# Patient Record
Sex: Male | Born: 1990 | Race: Black or African American | Hispanic: No | Marital: Single | Smoking: Former smoker
Health system: Southern US, Community
[De-identification: ages and names within clinical notes are randomized; demographics above are authoritative.]

## PROBLEM LIST (undated history)

## (undated) DIAGNOSIS — I480 Paroxysmal atrial fibrillation: Secondary | ICD-10-CM

## (undated) DIAGNOSIS — A749 Chlamydial infection, unspecified: Secondary | ICD-10-CM

## (undated) DIAGNOSIS — A64 Unspecified sexually transmitted disease: Secondary | ICD-10-CM

## (undated) HISTORY — DX: Unspecified sexually transmitted disease: A64

## (undated) HISTORY — DX: Paroxysmal atrial fibrillation: I48.0

---
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
## 2012-10-12 ENCOUNTER — Encounter (HOSPITAL_COMMUNITY): Payer: Self-pay | Admitting: *Deleted

## 2012-10-12 ENCOUNTER — Emergency Department (HOSPITAL_COMMUNITY)
Admission: EM | Admit: 2012-10-12 | Discharge: 2012-10-12 | Disposition: A | Discharge status: Home / Self Care | Payer: 59 | Source: Home / Self Care | Attending: Family Medicine | Admitting: Family Medicine

## 2012-10-12 DIAGNOSIS — Z043 Encounter for examination and observation following other accident: Secondary | ICD-10-CM

## 2012-10-12 DIAGNOSIS — Z041 Encounter for examination and observation following transport accident: Secondary | ICD-10-CM
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
## 2013-07-12 ENCOUNTER — Ambulatory Visit: Payer: 59 | Admitting: Family Medicine

## 2013-07-12 VITALS — BP 100/72 | HR 60 | Temp 97.9°F | Resp 18 | Ht 65.0 in | Wt 136.0 lb

## 2013-07-12 NOTE — Progress Notes (Signed)
  Subjective:    Patient ID: Allen Wang, male    DOB: 12/13/1990, 22 y.o.   MRN: [id number]  HPI Allen Wang is a 22 y.o. male No known medical problems.  First visit to this office.   Has paperwork  to complete to donate plasma. Has donated in past - 2 years in a row - ended in April.  Had a reaction during blood return with saline in April of this year, has not donated since that time. Felt like there was air in tube, had pain in arm, no rash, no swelling, pain in R arm for about a week. No bruising. Not really sure what happened. Told not to donate again until has had form completed., but no form to be completed here.   Their facility does have a doctor that meets with them Mondays and Wednesdays.    Review of Systems     Objective:   Physical Exam        Assessment & Plan:  No specific form available here. I am not sure what specific information is needed as no form provided. Gave patient my card to have donation center call with specific info requested, or may be better to meet with doctor that attends center tomorrow, and then can return if needed for specific testing or clearance required once more detailed info needed.

## 2013-07-20 ENCOUNTER — Telehealth: Payer: Self-pay

## 2013-07-20 NOTE — Telephone Encounter (Signed)
Form can not be completed at this point. Patient will need to be seen for this, and we need more information from the plasma center concerning the problems he had related to his right arm pain.

## 2013-07-20 NOTE — Telephone Encounter (Signed)
Pt dropped off a form for Dr Neva Seat to complete concerning complications from plasma donation. I have put the form in Dr Paralee Cancel box.

## 2013-07-30 NOTE — Telephone Encounter (Signed)
Allen Wang, did you contact pt with information? Can we close encounter?

## 2013-08-01 NOTE — Telephone Encounter (Signed)
We can close, patient not returning my calls.

## 2013-08-02 ENCOUNTER — Ambulatory Visit (INDEPENDENT_AMBULATORY_CARE_PROVIDER_SITE_OTHER): Payer: 59 | Admitting: Family Medicine

## 2013-08-02 VITALS — BP 118/76 | HR 74 | Temp 97.8°F | Resp 16 | Ht 66.0 in | Wt 140.6 lb

## 2013-08-02 DIAGNOSIS — Z23 Encounter for immunization: Secondary | ICD-10-CM

## 2013-08-02 DIAGNOSIS — Z0000 Encounter for general adult medical examination without abnormal findings: Secondary | ICD-10-CM

## 2013-08-02 DIAGNOSIS — Z7251 High risk heterosexual behavior: Secondary | ICD-10-CM

## 2013-08-02 LAB — POCT CBC
Granulocyte percent: 61.3 %G (ref 37–80)
MCV: 92.5 fL (ref 80–97)
MID (cbc): 0.5 (ref 0–0.9)
POC LYMPH PERCENT: 32 %L (ref 10–50)
Platelet Count, POC: 245 10*3/uL (ref 142–424)
RDW, POC: 13.6 %

## 2013-08-02 LAB — POCT URINALYSIS DIPSTICK
Bilirubin, UA: NEGATIVE
Blood, UA: NEGATIVE
Ketones, UA: NEGATIVE
Leukocytes, UA: NEGATIVE
pH, UA: 7.5

## 2013-08-02 NOTE — Progress Notes (Signed)
[address]   [place], [place]  [address]   [phone]  Subjective:    Patient ID: Allen Wang, male    DOB: 02-16-1991, 22 y.o.   MRN: [id number]  HPI This 22 y.o. male presents for evaluation of CPE.  Last physical high school. Colonoscopy never. Tetanus vaccine several years ago.   Flu vaccines in a few years.  Agreeable. Gardisil series never.   Eye exam never; no glasses or contacts. Dental exam every six months.  Review of Systems  Constitutional: Negative.   HENT: Negative.   Eyes: Negative.   Respiratory: Negative.   Cardiovascular: Negative.   Gastrointestinal: Negative.   Endocrine: Negative.   Genitourinary: Negative.   Musculoskeletal: Negative.   Skin: Negative.   Allergic/Immunologic: Negative.   Neurological: Negative.   Hematological: Negative.   Psychiatric/Behavioral: Negative.    History reviewed. No pertinent past medical history. History reviewed. No pertinent past surgical history. No Known Allergies No current outpatient prescriptions on file prior to visit.   No current facility-administered medications on file prior to visit.   History   Social History  . Marital Status: Single    Spouse Name: N/A    Number of Children: N/A  . Years of Education: N/A   Occupational History  . Not on file.   Social History Main Topics  . Smoking status: Former Smoker    Types: Cigars  . Smokeless tobacco: Not on file  . Alcohol Use: Yes     Comment: occasional  . Drug Use: Yes    Special: Marijuana     Comment: none for 2 yrs.  . Sexual Activity: Yes   Other Topics Concern  . Not on file   Social History Narrative   Marital status: single; dates females only.      Children: none      Lives: with parents.      Employment: [employer]; Product/process development scientist; likes job; 40 hours per week.      Tobacco: none       Alcohol: weekends; no DWIs.       Drugs: none       Sexual activity:  Age 33; total partners = 5; no history of STDs; condoms  100%.      Seatbelts:  100%      Guns: none         Family History  Problem Relation Age of Onset  . Diabetes Mother   . Hypertension Mother   . Diabetes Maternal Grandmother   . Depression Maternal Grandfather        Objective:   Physical Exam  Nursing note and vitals reviewed. Constitutional: He is oriented to person, place, and time. He appears well-developed and well-nourished. No distress.  HENT:  Head: Normocephalic and atraumatic.  Right Ear: External ear normal.  Left Ear: External ear normal.  Nose: Nose normal.  Mouth/Throat: Oropharynx is clear and moist.  Eyes: Conjunctivae and EOM are normal. Pupils are equal, round, and reactive to light.  Neck: Normal range of motion. Neck supple. No thyromegaly present.  Cardiovascular: Normal rate, regular rhythm, normal heart sounds and intact distal pulses.   No murmur heard. Pulmonary/Chest: Effort normal and breath sounds normal. No respiratory distress. He has no wheezes. He has no rales.  Abdominal: Soft. Bowel sounds are normal. He exhibits no distension and no mass. There is no tenderness. There is no rebound and no guarding. Hernia confirmed negative in the right inguinal area and confirmed negative in the  left inguinal area.  Genitourinary: Testes normal and penis normal.  Musculoskeletal: Normal range of motion.  Lymphadenopathy:    He has no cervical adenopathy.       Right: No inguinal adenopathy present.       Left: No inguinal adenopathy present.  Neurological: He is alert and oriented to person, place, and time. He has normal reflexes. No cranial nerve deficit. He exhibits normal muscle tone. Coordination normal.  Skin: Skin is warm and dry. No rash noted. He is not diaphoretic. No erythema. No pallor.  Psychiatric: He has a normal mood and affect. His behavior is normal. Judgment and thought content normal.   INFLUENZA VACCINE ADMINISTERED.    Assessment & Plan:  Routine general medical examination at a  health care facility - Plan: POCT urinalysis dipstick, POCT CBC, Comprehensive metabolic panel, RPR, HIV antibody, Lipid panel, GC/Chlamydia Probe Amp  Problems related to high-risk sexual behavior - Plan: POCT urinalysis dipstick, RPR, HIV antibody, GC/Chlamydia Probe Amp  Need for prophylactic vaccination and inoculation against influenza - Plan: Flu Vaccine QUAD 36+ mos IM  1.  CPE: anticipatory guidance provided.  S/p flu vaccine in office. Obtain labs. Per current guidelines, obtain GC/Chlam. 2.  STD screening/problems related to high risk sexual behavior:  New. Obtain Gc/Chlam, RPR, HIV.  Counseling provided on HIV screening.  Safe sex practices reviewed. 3.  S/p flu vaccine.

## 2013-08-02 NOTE — Patient Instructions (Addendum)
1. Call insurance regarding coverage of Gardisil series.  Human Papillomavirus (HPV) Gardasil Vaccine What You Need to Know WHAT IS HPV?  Genital human papillomavirus (HPV) is the most common sexually transmitted virus in the [place]. More than half of sexually active men and women are infected with HPV at some time in their lives.  About 20 million Americans are currently infected, and about 6 million more get infected each year. HPV is usually spread through sexual contact.  Most HPV infections do not cause any symptoms and go away on their own. But HPV can cause cervical cancer in women. Cervical cancer is the 2nd leading cause of cancer deaths among women around the world. In the [place], about 12,000 women get cervical cancer every year and about 4,000 are expected to die from it.  HPV is also associated with several less common cancers, such as vaginal and vulvar cancers in women, and anal and oropharyngeal (back of the throat, including base of tongue and tonsils) cancers in both men and women. HPV can also cause genital warts and warts in the throat.  There is no cure for HPV infection, but some of the problems it causes can be treated. HPV VACCINE: WHY GET VACCINATED?  The HPV vaccine you are getting is 1 of 2 vaccines that can be given to prevent HPV. It may be given to both males and females.  This vaccine can prevent most cases of cervical cancer in females, if it is given before exposure to the virus. In addition, it can prevent vaginal and vulvar cancer in females, and genital warts and anal cancer in both males and females.  Protection from HPV vaccine is expected to be long-lasting. But vaccination is not a substitute for cervical cancer screening. Women should still get regular Pap tests. WHO SHOULD GET THIS HPV VACCINE AND WHEN? HPV vaccine is given as a 3-dose series.  1st Dose: Now.  2nd Dose: 1 to 2 months after Dose 1.  3rd Dose: 6 months after Dose  1. Additional (booster) doses are not recommended. Routine Vaccination This HPV vaccine is recommended for girls and boys 5 or 20 years of age. It may be given starting at age 35. Why is HPV vaccine recommended at 66 or 20 years of age?  HPV infection is easily acquired, even with only one sex partner. That is why it is important to get HPV vaccine before any sexual conact takes place. Also, response to the vaccine is better at this age than at older ages. Catch-Up Vaccination This vaccine is recommended for the following people who have not completed the 3-dose series:   Females 13 through 20 years of age.  Males 13 through 20 years of age. This vaccine may be given to men 22 through 20 years of age who have not completed the 3-dose series. It is recommended for men through age 17 who have sex with men or whose immune system is weakened because of HIV infection, other illness, or medications.  HPV vaccine may be given at the same time as other vaccines. SOME PEOPLE SHOULD NOT GET HPV VACCINE OR SHOULD WAIT  Anyone who has ever had a life-threatening allergic reaction to any other component of HPV vaccine, or to a previous dose of HPV vaccine, should not get the vaccine. Tell your doctor if the person getting vaccinated has any severe allergies, including an allergy to yeast.  HPV vaccine is not recommended for pregnant women. However, receiving HPV vaccine when pregnant  is not a reason to consider terminating the pregnancy. Women who are breastfeeding may get the vaccine.  People who are mildly ill when a dose of HPV is planned can still be vaccinated. People with a moderate or severe illness should wait until they are better. WHAT ARE THE RISKS FROM THIS VACCINE?  This HPV vaccine has been used in the [place] and around the world for about 6 years and has been very safe.  However, any medicine could possibly cause a serious problem, such as a severe allergic reaction. The risk of any vaccine  causing a serious injury, or death, is extremely small.  Life-threatening allergic reactions from vaccines are very rare. If they do occur, it would be within a few minutes to a few hours after the vaccination. Several mild to moderate problems are known to occur with HPV vaccine. These do not last long and go away on their own.  Reactions in the arm where the shot was given:  Pain (about 8 people in 10).  Redness or swelling (about 1 person in 4).  Fever:  Mild (100 F or 37.8 C) (about 1 person in 10).  Moderate (102 F or 38.9 C) (about 1 person in 67).  Other problems:  Headache (about 1 person in 3).  Fainting: Brief fainting spells and related symptoms (such as jerking movements) can happen after any medical procedure, including vaccination. Sitting or lying down for about 15 minutes after a vaccination can help prevent fainting and injuries caused by falls. Tell your doctor if the patient feels dizzy or lightheaded, or has vision changes or ringing in the ears.  Like all vaccines, HPV vaccines will continue to be monitored for unusual or severe problems. WHAT IF THERE IS A SERIOUS REACTION? What should I look for?  Any unusual condition, such as a high fever or unusual behavior. Signs of a serious allergic reaction can include difficulty breathing, hoarseness or wheezing, hives, paleness, weakness, a fast heartbeat, or dizziness. What should I do?  Call a doctor, or get the person to a doctor right away.  Tell your doctor what happened, the date and time it happened, and when the vaccination was given.  Ask your doctor, nurse, or health department to report the reaction by filing a Vaccine Adverse Event Reporting System (VAERS) form. Or, you can file this report through the VAERS website at www.vaers.LAgents.no or by calling 1-(339)252-6647. VAERS does not provide medical advice. THE NATIONAL VACCINE INJURY COMPENSATION PROGRAM  The National Vaccine Injury Compensation  Program (VICP) is a federal program that was created to compensate people who may have been injured by certain vaccines.  Persons who believe they may have been injured by a vaccine can learn about the program and about filing a claim by calling 1-(609)862-1721 or visiting the VICP website at SpiritualWord.at HOW CAN I LEARN MORE?  Ask your doctor.  Call your local or state health department.  Contact the Centers for Disease Control and Prevention (CDC):  Call (949)886-5695 (1-800-CDC-INFO)  or  Visit CDC's website at PicCapture.uy CDC Human Papillomavirus (HPV) Gardasil (Interim) 04/09/12 Document Released: 09/07/2006 Document Revised: 08/04/2012 Document Reviewed: 01/15/2011 [facility] Patient Information 2014 [place], [place].

## 2013-08-03 LAB — COMPREHENSIVE METABOLIC PANEL
AST: 34 U/L (ref 0–37)
Albumin: 4.9 g/dL (ref 3.5–5.2)
Alkaline Phosphatase: 68 U/L (ref 39–117)
BUN: 12 mg/dL (ref 6–23)
Creat: 1.02 mg/dL (ref 0.50–1.35)
Potassium: 4.1 mEq/L (ref 3.5–5.3)

## 2013-08-03 LAB — LIPID PANEL
Cholesterol: 185 mg/dL (ref 0–200)
HDL: 70 mg/dL (ref >39)
Total CHOL/HDL Ratio: 2.6 Ratio
Triglycerides: 131 mg/dL (ref <150)
VLDL: 26 mg/dL (ref 0–40)

## 2013-08-23 ENCOUNTER — Ambulatory Visit (INDEPENDENT_AMBULATORY_CARE_PROVIDER_SITE_OTHER): Payer: 59 | Admitting: Family Medicine

## 2013-08-23 VITALS — BP 116/68 | HR 82 | Temp 97.9°F | Resp 18 | Ht 65.0 in | Wt 136.0 lb

## 2013-08-23 DIAGNOSIS — Z0000 Encounter for general adult medical examination without abnormal findings: Secondary | ICD-10-CM
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
## 2014-03-11 ENCOUNTER — Ambulatory Visit (INDEPENDENT_AMBULATORY_CARE_PROVIDER_SITE_OTHER): Payer: 59 | Admitting: Physician Assistant

## 2014-03-11 VITALS — BP 110/70 | HR 73 | Temp 98.6°F | Resp 18 | Ht 65.0 in | Wt 133.0 lb

## 2014-03-11 DIAGNOSIS — Z202 Contact with and (suspected) exposure to infections with a predominantly sexual mode of transmission: Secondary | ICD-10-CM

## 2014-03-11 DIAGNOSIS — Z113 Encounter for screening for infections with a predominantly sexual mode of transmission: Secondary | ICD-10-CM

## 2014-03-11 LAB — RPR

## 2014-03-11 MED ORDER — AZITHROMYCIN 500 MG PO TABS: 1000.0000 mg | ORAL_TABLET | Freq: Once | ORAL | Status: DC

## 2014-03-11 NOTE — Progress Notes (Signed)
   Subjective:    Patient ID: Allen Wang, male    DOB: 11/02/1991, 23 y.o.   MRN: [id number]  HPI   Mr. Allen Wang is a pleasant 23 yr old male who reports that a former girlfriend told him that she tested positive for chlamydia.  He was last with this partner about 1 wk ago - no other partners since that time.  He did not use a condom with that encounter.  He currently is asymptomatic.  Denies dysuria, pain, penile discharge.  He was lasted tested for STIs in Sept 2014 - all neg.  He has never tested positive.     Review of Systems  Constitutional: Negative for fever and chills.  Respiratory: Negative.   Cardiovascular: Negative.   Gastrointestinal: Negative for nausea, vomiting and abdominal pain.  Genitourinary: Negative for dysuria, discharge, genital sores and testicular pain.  Musculoskeletal: Negative.   Skin: Negative.        Objective:   Physical Exam  Vitals reviewed. Constitutional: He is oriented to person, place, and time. He appears well-developed and well-nourished. No distress.  HENT:  Head: Normocephalic and atraumatic.  Eyes: Conjunctivae are normal. No scleral icterus.  Cardiovascular: Normal rate, regular rhythm and normal heart sounds.   Pulmonary/Chest: Effort normal and breath sounds normal. He has no wheezes. He has no rales.  Abdominal: Soft. There is no tenderness.  Neurological: He is alert and oriented to person, place, and time.  Skin: Skin is warm and dry.  Psychiatric: He has a normal mood and affect. His behavior is normal.       Assessment & Plan:  Exposure to chlamydia - Plan: GC/Chlamydia Probe Amp, azithromycin (ZITHROMAX) 500 MG tablet  Screen for STD (sexually transmitted disease) - Plan: HIV antibody, RPR   Mr. Allen Wang is a very pleasant 23 yr old male here for STD screening post chlamydia exposure.  Will treat empirically with 1g azithro x1.  Uriprobe, HIV, RPR sent.  Instructed pt to abstain from sexual activity for the next 7 days to  ensure that any infection completely clears.  Encouraged him to use condoms with every sexual encounter going forward.  Pt to call or RTC if worsening or not improving  E. Frances FurbishElizabeth Finnick Orosz MHS, PA-C [facility] 4/18/20153:38 PM

## 2014-03-11 NOTE — Patient Instructions (Signed)
We are treating you for chlamydia today as a precaution.  Take the two azithromycin pills today at one time.  Take with some food so it doesn't upset your stomach  Abstain from all sexual activity for the next 7 days to make sure any infection completely clears  Going forward, use condoms with every sexual encounter - this is the best way to protect yourself from infection  I will let you know when your labs are back and if we need to do anything else based on those    Safe Sex Safe sex is about reducing the risk of giving or getting a sexually transmitted disease (STD). STDs are spread through sexual contact involving the genitals, mouth, or rectum. Some STDS can be cured and others cannot. Safe sex can also prevent unintended pregnancies.  SAFE SEX PRACTICES  Limit your sexual activity to only one partner who is only having sex with you.  Talk to your partner about their past partners, past STDs, and drug use.  Use a condom every time you have sexual intercourse. This includes vaginal, oral, and anal sexual activity. Both females and males should wear condoms during oral sex. Only use latex or polyurethane condoms and water-based lubricants. Petroleum-based lubricants or oils used to lubricate a condom will weaken the condom and increase the chance that it will break. The condom should be in place from the beginning to the end of sexual activity. Wearing a condom reduces, but does not completely eliminate, your risk of getting or giving a STD. STDs can be spread by contact with skin of surrounding areas.  Get vaccinated for hepatitis B and HPV.  Avoid alcohol and recreational drugs which can affect your judgement. You may forget to use a condom or participate in high-risk sex.  For females, avoid douching after sexual intercourse. Douching can spread an infection farther into the reproductive tract.  Check your body for signs of sores, blisters, rashes, or unusual discharge. See your  caregiver if you notice any of these signs.  Avoid sexual contact if you have symptoms of an infection or are being treated for an STD. If you or your partner has herpes, avoid sexual contact when blisters are present. Use condoms at all other times.  See your caregiver for regular screenings, examinations, and tests for STDs. Before having sex with a new partner, each of you should be screened for STDs and talk about the results with your partner. BENEFITS OF SAFE SEX   There is less of a chance of getting or giving an STD.  You can prevent unwanted or unintended pregnancies.  By discussing safer sex concerns with your partner, you may increase feelings of intimacy, comfort, trust, and honesty between the both of you. Document Released: 12/18/2004 Document Revised: 08/04/2012 Document Reviewed: 05/03/2012 [facility] Patient Information 2014 [employer], [place].

## 2014-03-12 LAB — HIV ANTIBODY (ROUTINE TESTING W REFLEX): HIV 1&2 Ab, 4th Generation: NONREACTIVE

## 2014-03-13 LAB — GC/CHLAMYDIA PROBE AMP
CT PROBE, AMP APTIMA: NEGATIVE
GC PROBE AMP APTIMA: NEGATIVE

## 2014-04-25 ENCOUNTER — Ambulatory Visit (INDEPENDENT_AMBULATORY_CARE_PROVIDER_SITE_OTHER): Payer: 59 | Admitting: Emergency Medicine

## 2014-04-25 VITALS — BP 112/72 | HR 76 | Temp 97.6°F | Resp 18 | Ht 65.3 in | Wt 140.6 lb

## 2014-04-25 DIAGNOSIS — I808 Phlebitis and thrombophlebitis of other sites: Secondary | ICD-10-CM

## 2014-04-25 NOTE — Progress Notes (Signed)
   Subjective:    Patient ID: Allen Wang, male    DOB: 1991/10/21, 21 y.o.   MRN: [id number]  HPI 23 yo male with right arm pain after donating plasma 4 days ago.  He felt pain in antecubital fossa and had pain into bicep region.  No fever or chills.  No erythema.  States there was line that came up into bicep that ran about 2 inches long.  Now gone.  PPMH:  Noncontributory  SH:  Former smoker, occasional alcohol.   Review of Systems As per HPI, otherwise negative.    Objective:   Physical Exam Blood pressure 112/72, pulse 76, temperature 97.6 F (36.4 C), temperature source Oral, resp. rate 18, height 5' 5.3" (1.659 m), weight 140 lb 9.6 oz (63.776 kg), SpO2 100.00%. Body mass index is 23.17 kg/(m^2). Well-developed, well nourished male who is awake, alert and oriented, in NAD. HEENT: [place]/AT, PERRL, EOMI.  Sclera and conjunctiva are clear.  Lungs: normal effort Extremities: mild ttp to antecubital fossa.  No erythema.  No warmth. Skin: warm and dry without rash. Psychologic: good mood and appropriate affect, normal speech and behavior.      Assessment & Plan:  Superficial thrombophlebitis - recommended that he use warm compresses as needed for 4-5 days.  No need for antibiotics.

## 2016-01-03 ENCOUNTER — Encounter (HOSPITAL_COMMUNITY): Payer: Self-pay | Admitting: *Deleted

## 2016-01-03 ENCOUNTER — Emergency Department (HOSPITAL_COMMUNITY)
Admission: EM | Admit: 2016-01-03 | Discharge: 2016-01-04 | Disposition: A | Discharge status: Home / Self Care | Payer: 59 | Attending: Emergency Medicine | Admitting: Emergency Medicine

## 2016-01-03 ENCOUNTER — Emergency Department (HOSPITAL_COMMUNITY): Payer: 59

## 2016-01-03 DIAGNOSIS — I4891 Unspecified atrial fibrillation: Secondary | ICD-10-CM | POA: Diagnosis not present

## 2016-01-03 DIAGNOSIS — R002 Palpitations: Secondary | ICD-10-CM | POA: Diagnosis present

## 2016-01-03 DIAGNOSIS — Z87891 Personal history of nicotine dependence: Secondary | ICD-10-CM | POA: Insufficient documentation

## 2016-01-03 LAB — BASIC METABOLIC PANEL
Anion gap: 9 (ref 5–15)
BUN: 16 mg/dL (ref 6–20)
CALCIUM: 9.5 mg/dL (ref 8.9–10.3)
CHLORIDE: 104 mmol/L (ref 101–111)
CO2: 27 mmol/L (ref 22–32)
CREATININE: 0.79 mg/dL (ref 0.61–1.24)
GFR calc non Af Amer: >60 mL/min (ref >60)
Glucose, Bld: 90 mg/dL (ref 65–99)
Potassium: 3.7 mmol/L (ref 3.5–5.1)
SODIUM: 140 mmol/L (ref 135–145)

## 2016-01-03 LAB — CBC
HCT: 44.3 % (ref 39.0–52.0)
Hemoglobin: 15.2 g/dL (ref 13.0–17.0)
MCH: 29.7 pg (ref 26.0–34.0)
MCHC: 34.3 g/dL (ref 30.0–36.0)
MCV: 86.5 fL (ref 78.0–100.0)
PLATELETS: 252 10*3/uL (ref 150–400)
RBC: 5.12 MIL/uL (ref 4.22–5.81)
RDW: 12.9 % (ref 11.5–15.5)
WBC: 7.4 10*3/uL (ref 4.0–10.5)

## 2016-01-03 LAB — PROTIME-INR
INR: 1.06 (ref 0.00–1.49)
PROTHROMBIN TIME: 13.6 s (ref 11.6–15.2)

## 2016-01-03 LAB — RAPID URINE DRUG SCREEN, HOSP PERFORMED
AMPHETAMINES: NOT DETECTED
BARBITURATES: NOT DETECTED
BENZODIAZEPINES: NOT DETECTED
COCAINE: NOT DETECTED
OPIATES: NOT DETECTED
TETRAHYDROCANNABINOL: NOT DETECTED

## 2016-01-03 LAB — APTT: aPTT: 30 seconds (ref 24–37)

## 2016-01-03 LAB — I-STAT TROPONIN, ED: TROPONIN I, POC: 0 ng/mL (ref 0.00–0.08)

## 2016-01-03 LAB — MAGNESIUM: Magnesium: 2 mg/dL (ref 1.7–2.4)

## 2016-01-03 MED ORDER — DILTIAZEM HCL 100 MG IV SOLR
5.0000 mg/h | INTRAVENOUS | Status: DC
  Administered 2016-01-03: 5 mg/h via INTRAVENOUS
  Filled 2016-01-03: qty 100

## 2016-01-03 MED ORDER — SODIUM CHLORIDE 0.9 % IV BOLUS (SEPSIS)
1000.0000 mL | Freq: Once | INTRAVENOUS | Status: AC
  Administered 2016-01-03: 1000 mL via INTRAVENOUS

## 2016-01-03 MED ORDER — SODIUM CHLORIDE 0.9 % IV SOLN
INTRAVENOUS | Status: DC
  Administered 2016-01-04: 01:00:00 via INTRAVENOUS

## 2016-01-03 MED ORDER — DILTIAZEM LOAD VIA INFUSION
20.0000 mg | Freq: Once | INTRAVENOUS | Status: AC
  Administered 2016-01-03: 20 mg via INTRAVENOUS
  Filled 2016-01-03: qty 20

## 2016-01-03 NOTE — ED Notes (Signed)
Pt. On cardiac monitor. 

## 2016-01-03 NOTE — ED Provider Notes (Signed)
CSN: [id number]     Arrival date & time 01/03/16  2111 History   First MD Initiated Contact with Patient 01/03/16 2143     Chief Complaint  Patient presents with  . Palpitations   Patient is a 25 y.o. male presenting with palpitations.  Palpitations Palpitations quality:  Fast (fast) Onset quality:  Sudden Duration:  3 hours Timing:  Constant Chronicity:  New Context: not appetite suppressants, not caffeine, not exercise, not hyperventilation, not illicit drugs and not stimulant use   Relieved by:  Nothing Ineffective treatments:  None tried Pt noticed the symptoms suddenly while watching TV.  He felt his heart beating fast and irregularly.  No alcohol or drug use.  NO prior episodes like this.  History reviewed. No pertinent past medical history. History reviewed. No pertinent past surgical history. Family History  Problem Relation Age of Onset  . Diabetes Mother   . Hypertension Mother   . Diabetes Maternal Grandmother   . Depression Maternal Grandfather    Social History  Substance Use Topics  . Smoking status: Former Smoker    Types: Cigars  . Smokeless tobacco: None  . Alcohol Use: Yes     Comment: occasional    Review of Systems  Cardiovascular: Positive for palpitations.  All other systems reviewed and are negative.     Allergies  Review of patient's allergies indicates no known allergies.  Home Medications   Prior to Admission medications   Not on File   BP 113/81 mmHg  Pulse 115  Temp(Src) 97.8 F (36.6 C) (Oral)  Resp 19  SpO2 99% Physical Exam  Constitutional: He appears well-developed and well-nourished. No distress.  HENT:  Head: Normocephalic and atraumatic.  Right Ear: External ear normal.  Left Ear: External ear normal.  Eyes: Conjunctivae are normal. Right eye exhibits no discharge. Left eye exhibits no discharge. No scleral icterus.  Neck: Neck supple. No tracheal deviation present. No thyromegaly present.  Cardiovascular: Intact  distal pulses.  An irregularly irregular rhythm present. Tachycardia present.   Pulmonary/Chest: Effort normal and breath sounds normal. No stridor. No respiratory distress. He has no wheezes. He has no rales.  Abdominal: Soft. Bowel sounds are normal. He exhibits no distension. There is no tenderness. There is no rebound and no guarding.  Musculoskeletal: He exhibits no edema or tenderness.  Neurological: He is alert. He has normal strength. No cranial nerve deficit (no facial droop, extraocular movements intact, no slurred speech) or sensory deficit. He exhibits normal muscle tone. He displays no seizure activity. Coordination normal.  Skin: Skin is warm and dry. No rash noted.  Psychiatric: He has a normal mood and affect.  Nursing note and vitals reviewed.   ED Course  Procedures (including critical care time) Labs Review Labs Reviewed  BASIC METABOLIC PANEL  CBC  APTT  PROTIME-INR  MAGNESIUM  URINE RAPID DRUG SCREEN, HOSP PERFORMED  TSH  T4, FREE  I-STAT TROPOININ, ED  Rosezena Sensor, ED    Imaging Review Dg Chest 2 View  01/03/2016  CLINICAL DATA:  25 year old male with tachycardia and chest pain EXAM: CHEST  2 VIEW COMPARISON:  None. FINDINGS: The heart size and mediastinal contours are within normal limits. Both lungs are clear. The visualized skeletal structures are unremarkable. IMPRESSION: No active cardiopulmonary disease. Electronically Signed   By: Elgie Collard M.D.   On: 01/03/2016 21:57   I have personally reviewed and evaluated these images and lab results as part of my medical decision-making.   EKG Interpretation  Date/Time:  Thursday January 03 2016 21:38:56 EST Ventricular Rate:  136 PR Interval:    QRS Duration: 90 QT Interval:  311 QTC Calculation: 468 R Axis:   70 Text Interpretation:  Atrial fibrillation ST elevation, consider inferior  injury Baseline wander in lead(s) II aVF V5 No old tracing to compare  Confirmed by Dessirae Scarola  MD-J, Tyrell Brereton  ([id number]) on 01/03/2016 9:44:25 PM      MDM   Final diagnoses:  Atrial fibrillation with tachycardic ventricular rate (HCC)    Pt has new onset a fib.  Etiology unclear.  No thyromegally.  Denies stimulant use. Chadsvasc2 is 0.  Will give cardizem to bring his rate down.  Plan on reassessment and consult with cardiology.  Dr Nicanor Alcon will follow    Linwood Dibbles, MD 01/03/16 2352

## 2016-01-03 NOTE — ED Notes (Signed)
Pt states he was having a "fast heart rate and pressure in my chest" while watching tv today, starting around 7pm, comes and goes.

## 2016-01-04 LAB — T4, FREE: Free T4: 0.72 ng/dL (ref 0.61–1.12)

## 2016-01-04 LAB — TSH: TSH: 2.594 u[IU]/mL (ref 0.350–4.500)

## 2016-01-04 MED ORDER — ASPIRIN 81 MG PO CHEW: 81.0000 mg | CHEWABLE_TABLET | Freq: Every day | ORAL | Status: DC

## 2016-01-04 NOTE — Discharge Instructions (Signed)
Atrial Fibrillation °Atrial fibrillation is a type of heartbeat that is irregular or fast (rapid). If you have this condition, your heart keeps quivering in a weird (chaotic) way. This condition can make it so your heart cannot pump blood normally. Having this condition gives a person more risk for stroke, heart failure, and other heart problems. There are different types of atrial fibrillation. Talk with your doctor to learn about the type that you have. °HOME CARE °· Take over-the-counter and prescription medicines only as told by your doctor. °· If your doctor prescribed a blood-thinning medicine, take it exactly as told. Taking too much of it can cause bleeding. If you do not take enough of it, you will not have the protection that you need against stroke and other problems. °· Do not use any tobacco products. These include cigarettes, chewing tobacco, and e-cigarettes. If you need help quitting, ask your doctor. °· If you have apnea (obstructive sleep apnea), manage it as told by your doctor. °· Do not drink alcohol. °· Do not drink beverages that have caffeine. These include coffee, soda, and tea. °· Maintain a healthy weight. Do not use diet pills unless your doctor says they are safe for you. Diet pills may make heart problems worse. °· Follow diet instructions as told by your doctor. °· Exercise regularly as told by your doctor. °· Keep all follow-up visits as told by your doctor. This is important. °GET HELP IF: °· You notice a change in the speed, rhythm, or strength of your heartbeat. °· You are taking a blood-thinning medicine and you notice more bruising. °· You get tired more easily when you move or exercise. °GET HELP RIGHT AWAY IF: °· You have pain in your chest or your belly (abdomen). °· You have sweating or weakness. °· You feel sick to your stomach (nauseous). °· You notice blood in your throw up (vomit), poop (stool), or pee (urine). °· You are short of breath. °· You suddenly have swollen feet  and ankles. °· You feel dizzy. °· Your suddenly get weak or numb in your face, arms, or legs, especially if it happens on one side of your body. °· You have trouble talking, trouble understanding, or both. °· Your face or your eyelid droops on one side. °These symptoms may be an emergency. Do not wait to see if the symptoms will go away. Get medical help right away. Call your local emergency services (911 in the [place] Do not drive yourself to the hospital. °  °This information is not intended to replace advice given to you by your health care provider. Make sure you discuss any questions you have with your health care provider. °  °Document Released: 08/19/2008 Document Revised: 08/01/2015 Document Reviewed: 03/07/2015 °Elsevier Interactive Patient Education ©2016 Elsevier Inc. ° °

## 2016-01-07 ENCOUNTER — Telehealth: Payer: Self-pay | Admitting: Cardiology

## 2016-01-09 ENCOUNTER — Ambulatory Visit: Payer: 59 | Admitting: Cardiovascular Disease

## 2016-01-29 NOTE — Telephone Encounter (Signed)
Closed encounter °

## 2016-02-27 ENCOUNTER — Encounter: Payer: Self-pay | Admitting: Cardiovascular Disease

## 2016-02-27 ENCOUNTER — Ambulatory Visit (INDEPENDENT_AMBULATORY_CARE_PROVIDER_SITE_OTHER): Payer: 59 | Admitting: Cardiovascular Disease

## 2016-02-27 VITALS — BP 132/66 | HR 78 | Ht 65.0 in | Wt 163.0 lb

## 2016-02-27 DIAGNOSIS — I480 Paroxysmal atrial fibrillation: Secondary | ICD-10-CM

## 2016-02-27 NOTE — Progress Notes (Signed)
02/27/2016 Allen Wang   10-05-1991  [id number]  Primary Physician AMOS, Arelia Longest, MD Primary Cardiologist: Runell Gess MD Roseanne Reno   HPI:  Mr. Teal is a delightful 25 year old fit appearing single African-American male with no children who works at [employer]. He was referred by the emergency room for cardiovascular evaluation because of an episode of PAF. He has no cardiac risk factors. He developed tachycardia palpitations along with some atypical chest pain after drinking an excessive amount of caffeine. He was seen in the emergency room where his rate was controlled with IV diltiazem. He converted to sinus rhythm. He was treated with baby aspirin sent home. He's had no recurrent episodes. He is s otherwise asymptomatic.   Current Outpatient Prescriptions  Medication Sig Dispense Refill  . aspirin 81 MG chewable tablet Chew 1 tablet (81 mg total) by mouth daily. 30 tablet 0   No current facility-administered medications for this visit.    No Known Allergies  Social History   Social History  . Marital Status: Single    Spouse Name: N/A  . Number of Children: N/A  . Years of Education: N/A   Occupational History  . Not on file.   Social History Main Topics  . Smoking status: Former Smoker    Types: Cigars  . Smokeless tobacco: Not on file  . Alcohol Use: Yes     Comment: occasional  . Drug Use: Yes    Special: Marijuana     Comment: none for 2 yrs.  . Sexual Activity: Yes   Other Topics Concern  . Not on file   Social History Narrative   Marital status: single; dates females only.      Children: none      Lives: with parents.      Employment: [employer]; Product/process development scientist; likes job; 40 hours per week.      Tobacco: none       Alcohol: weekends; no DWIs.       Drugs: none       Sexual activity:  Age 15; total partners = 5; no history of STDs; condoms 100%.      Seatbelts:  100%      Guns: none           Review of Systems: General:  negative for chills, fever, night sweats or weight changes.  Cardiovascular: negative for chest pain, dyspnea on exertion, edema, orthopnea, palpitations, paroxysmal nocturnal dyspnea or shortness of breath Dermatological: negative for rash Respiratory: negative for cough or wheezing Urologic: negative for hematuria Abdominal: negative for nausea, vomiting, diarrhea, bright red blood per rectum, melena, or hematemesis Neurologic: negative for visual changes, syncope, or dizziness All other systems reviewed and are otherwise negative except as noted above.    Blood pressure 132/66, pulse 78, height  (1.651 m), weight 163 lb (73.936 kg).  General appearance: alert and no distress Neck: no adenopathy, no carotid bruit, no JVD, supple, symmetrical, trachea midline and thyroid not enlarged, symmetric, no tenderness/mass/nodules Lungs: clear to auscultation bilaterally Heart: regular rate and rhythm, S1, S2 normal, no murmur, click, rub or gallop Extremities: extremities normal, atraumatic, no cyanosis or edema  EKG normal sinus rhythm at 78 and ST or T-wave changes. Personal review the EKG  ASSESSMENT AND PLAN:   PAF (paroxysmal atrial fibrillation) (HCC) Jarrel is a healthy 25 year old single Afro-American male who works at [employer]. He has no cardiac risk factors.he was seen at [facility] emergency room for  PAF with RVR probably brought on by excessive caffeine intake which he no longer drinks. He was rate controlled on IV diltiazem and converted to sinus rhythm and placed on a baby aspirin.The CHA2DSVASC2 score is 0  .No further workup is required at this time.      Runell GessJonathan J. Joslin Doell MD FACP,FACC,FAHA, [facility] 02/27/2016 3:39 PM

## 2016-02-27 NOTE — Assessment & Plan Note (Signed)
Allen Wang is a healthy 25 year old single Afro-American male who works at [employer]. He has no cardiac risk factors.[facility] for PAF with RVR probably brought on by excessive caffeine intake which he no longer drinks. He was rate controlled on IV diltiazem and converted to sinus rhythm and placed on a baby aspirin.The CHA2DSVASC2 score is 0  .No further workup is required at this time.

## 2016-02-27 NOTE — Patient Instructions (Signed)
Medication Instructions:  Your physician recommends that you continue on your current medications as directed. Please refer to the Current Medication list given to you today.   Labwork: NONE  Testing/Procedures: NONE  Follow-Up: Follow up with DR BERRY as needed.   Any Other Special Instructions Will Be Listed Below (If Applicable).     If you need a refill on your cardiac medications before your next appointment, please call your pharmacy.   

## 2019-11-22 ENCOUNTER — Ambulatory Visit
Admission: EM | Admit: 2019-11-22 | Discharge: 2019-11-22 | Disposition: A | Discharge status: Home / Self Care | Payer: 59 | Attending: Emergency Medicine | Admitting: Emergency Medicine

## 2019-11-22 ENCOUNTER — Encounter: Payer: Self-pay | Admitting: Emergency Medicine

## 2019-11-22 ENCOUNTER — Other Ambulatory Visit: Payer: Self-pay

## 2019-11-22 DIAGNOSIS — Z7251 High risk heterosexual behavior: Secondary | ICD-10-CM | POA: Insufficient documentation

## 2019-11-22 DIAGNOSIS — Z113 Encounter for screening for infections with a predominantly sexual mode of transmission: Secondary | ICD-10-CM

## 2019-11-22 LAB — POCT URINALYSIS DIP (MANUAL ENTRY)
Bilirubin, UA: NEGATIVE
Glucose, UA: NEGATIVE mg/dL
Ketones, POC UA: NEGATIVE mg/dL
Leukocytes, UA: NEGATIVE
Nitrite, UA: NEGATIVE
Protein Ur, POC: NEGATIVE mg/dL
Spec Grav, UA: 1.02 (ref 1.010–1.025)
Urobilinogen, UA: 0.2 E.U./dL
pH, UA: 7 (ref 5.0–8.0)

## 2019-11-22 NOTE — Discharge Instructions (Addendum)

## 2019-11-22 NOTE — ED Triage Notes (Signed)
Pt presents to [facility] for assessment after 2 weeks of right testicular pain x 2 weeks, slowly getting better.  Patient denies penile discharge, does c/o discomfort to testicle when urinating.

## 2019-11-22 NOTE — ED Notes (Signed)
Patient able to ambulate independently  

## 2019-11-22 NOTE — ED Provider Notes (Signed)
[facility]    CSN: [id number] Arrival date & time: 11/22/19  1044      History   Chief Complaint Chief Complaint  Patient presents with  . Testicle Pain    HPI Allen Wang is a 28 y.o. male history of paroxysmal A. fib presenting for 1 month course of right testicle discomfort.  Patient leg pain, swelling, discoloration.  States that it has been getting better gradually: Denies any discomfort today.  Patient denies penile pain, discharge, anogenital lesions.  Patient does endorse history of genital herpes: No flares since diagnosis.  Patient is currently sexually active, not routinely using condoms.   Past Medical History:  Diagnosis Date  . Paroxysmal atrial fibrillation [facility])     Patient Active Problem List   Diagnosis Date Noted  . PAF (paroxysmal atrial fibrillation) (HCC) 02/27/2016    History reviewed. No pertinent surgical history.     Home Medications    Prior to Admission medications   Not on File    Family History Family History  Problem Relation Age of Onset  . Diabetes Mother   . Hypertension Mother   . Diabetes Maternal Grandmother   . Depression Maternal Grandfather     Social History Social History   Tobacco Use  . Smoking status: Former Smoker    Types: Cigars  . Smokeless tobacco: Never Used  Substance Use Topics  . Alcohol use: Yes    Comment: occasional  . Drug use: Yes    Types: Marijuana    Comment: none for 2 yrs.     Allergies   Patient has no known allergies.   Review of Systems Review of Systems  Constitutional: Negative for fatigue and fever.  Gastrointestinal: Negative for abdominal pain and nausea.  Genitourinary: Positive for testicular pain. Negative for discharge, dysuria, frequency, genital sores, penile pain, penile swelling, scrotal swelling and urgency.       Resolved  Musculoskeletal: Negative for arthralgias and myalgias.  Skin: Negative for color change and rash.     Physical  Exam Triage Vital Signs ED Triage Vitals  Enc Vitals Group     BP 11/22/19 1130 (!) 143/91     Pulse Rate 11/22/19 1130 85     Resp 11/22/19 1130 16     Temp 11/22/19 1130 97.9 F (36.6 C)     Temp Source 11/22/19 1130 Oral     SpO2 11/22/19 1130 98 %     Weight --      Height --      Head Circumference --      Peak Flow --      Pain Score 11/22/19 1136 2     Pain Loc --      Pain Edu? --      Excl. in GC? --    No data found.  Updated Vital Signs BP (!) 143/91 (BP Location: Left Arm)   Pulse 85   Temp 97.9 F (36.6 C) (Oral)   Resp 16   SpO2 98%   Visual Acuity Right Eye Distance:   Left Eye Distance:   Bilateral Distance:    Right Eye Near:   Left Eye Near:    Bilateral Near:     Physical Exam Constitutional:      General: He is not in acute distress. HENT:     Head: Normocephalic and atraumatic.  Eyes:     General: No scleral icterus.    Pupils: Pupils are equal, round, and reactive to light.  Cardiovascular:     Rate and Rhythm: Normal rate.  Pulmonary:     Effort: Pulmonary effort is normal. No respiratory distress.     Breath sounds: No wheezing.  Genitourinary:    Penis: Normal.      Testes: Normal.     Comments: No discoloration of testes.  Right symmetric as compared to left.  No inguinal hernia appreciated.  Testes are nontender bilaterally Skin:    Coloration: Skin is not jaundiced or pale.  Neurological:     Mental Status: He is alert and oriented to person, place, and time.      UC Treatments / Results  Labs (all labs ordered are listed, but only abnormal results are displayed) Labs Reviewed  POCT URINALYSIS DIP (MANUAL ENTRY) - Abnormal; Notable for the following components:      Result Value   Blood, UA trace-intact (*)    All other components within normal limits  CYTOLOGY, (ORAL, ANAL, URETHRAL) ANCILLARY ONLY    EKG   Radiology No results found.  Procedures Procedures (including critical care time)  Medications  Ordered in UC Medications - No data to display  Initial Impression / Assessment and Plan / UC Course  I have reviewed the triage vital signs and the nursing notes.  Pertinent labs & imaging results that were available during my care of the patient were reviewed by me and considered in my medical decision making (see chart for details).     POC urine dipstick done office, reviewed by me: Showing trace intact blood.  Low concern for UTI at this time.  Given patient's history course with resolved testicular pain, urethral cytology obtained: Will withhold treatment until results are back as patient is currently asymptomatic and without discharge.  Return precautions discussed, patient verbalized understanding and is agreeable to plan. Final Clinical Impressions(s) / UC Diagnoses   Final diagnoses:  Unprotected sex  Screening examination for venereal disease     Discharge Instructions     Testing for chlamydia, gonorrhea, trichomonas is pending: please look for these results on the MyChart app/website.  We will notify you if you are positive and outline treatment at that time.  Important to avoid all forms of sexual intercourse (oral, vaginal, anal) with any/all partners for the next 7 days to avoid spreading/reinfecting. Any/all sexual partners should be notified of testing/treatment today.  Return for persistent/worsening symptoms or if you develop fever, abdominal or pelvic pain, blood in your urine, or are re-exposed to an STI.    ED Prescriptions    None     PDMP not reviewed this encounter.   Hall[place], [place], [place] 11/22/19 1239

## 2019-11-23 LAB — CYTOLOGY, (ORAL, ANAL, URETHRAL) ANCILLARY ONLY
Chlamydia: POSITIVE — AB
Neisseria Gonorrhea: NEGATIVE
Trichomonas: NEGATIVE

## 2019-11-24 ENCOUNTER — Telehealth (HOSPITAL_COMMUNITY): Payer: Self-pay | Admitting: Emergency Medicine

## 2019-11-24 MED ORDER — AZITHROMYCIN 250 MG PO TABS: 1000.0000 mg | ORAL_TABLET | Freq: Once | ORAL | 0 refills | Status: AC

## 2019-11-24 NOTE — Telephone Encounter (Signed)
Chlamydia is positive.  Rx po zithromax 1g #1 dose no refills was sent to the pharmacy of record.  Pt needs education to please refrain from sexual intercourse for 7 days to give the medicine time to work, sexual partners need to be notified and tested/treated.  Condoms may reduce risk of reinfection.  Recheck or followup with PCP for further evaluation if symptoms are not improving.   [facility] notified.  Patient contacted by phone and made aware of    results. Pt verbalized understanding and had all questions answered.

## 2019-11-24 NOTE — Addendum Note (Signed)
Addended by: Sandria Manly on: 11/24/2019 02:31 PM   Modules accepted: Orders

## 2019-12-02 ENCOUNTER — Emergency Department (HOSPITAL_COMMUNITY)
Admission: EM | Admit: 2019-12-02 | Discharge: 2019-12-02 | Disposition: A | Discharge status: Home / Self Care | Payer: Self-pay | Attending: Emergency Medicine | Admitting: Emergency Medicine

## 2019-12-02 ENCOUNTER — Encounter (HOSPITAL_COMMUNITY): Payer: Self-pay | Admitting: *Deleted

## 2019-12-02 ENCOUNTER — Emergency Department (HOSPITAL_COMMUNITY): Payer: Self-pay

## 2019-12-02 DIAGNOSIS — N50812 Left testicular pain: Secondary | ICD-10-CM | POA: Insufficient documentation

## 2019-12-02 DIAGNOSIS — N50811 Right testicular pain: Secondary | ICD-10-CM | POA: Insufficient documentation

## 2019-12-02 DIAGNOSIS — Z87891 Personal history of nicotine dependence: Secondary | ICD-10-CM | POA: Insufficient documentation

## 2019-12-02 HISTORY — DX: Chlamydial infection, unspecified: A74.9

## 2019-12-02 LAB — URINALYSIS, ROUTINE W REFLEX MICROSCOPIC
Bilirubin Urine: NEGATIVE
Glucose, UA: NEGATIVE mg/dL
Hgb urine dipstick: NEGATIVE
Ketones, ur: NEGATIVE mg/dL
Leukocytes,Ua: NEGATIVE
Nitrite: NEGATIVE
Protein, ur: NEGATIVE mg/dL
Specific Gravity, Urine: 1.016 (ref 1.005–1.030)
pH: 6 (ref 5.0–8.0)

## 2019-12-02 MED ORDER — CEFTRIAXONE SODIUM 1 G IJ SOLR
500.0000 mg | Freq: Once | INTRAMUSCULAR | Status: AC
Start: 2019-12-02 — End: 2019-12-02
  Administered 2019-12-02: 22:00:00 500 mg via INTRAMUSCULAR

## 2019-12-02 MED ORDER — LIDOCAINE HCL (PF) 1 % IJ SOLN
5.0000 mL | Freq: Once | INTRAMUSCULAR | Status: AC
  Administered 2019-12-02: 5 mL
  Filled 2019-12-02: qty 5

## 2019-12-02 MED ORDER — DOXYCYCLINE HYCLATE 100 MG PO TABS
100.0000 mg | ORAL_TABLET | Freq: Once | ORAL | Status: AC
  Administered 2019-12-02: 100 mg via ORAL
  Filled 2019-12-02: qty 1

## 2019-12-02 MED ORDER — DOXYCYCLINE HYCLATE 100 MG PO CAPS: 100.0000 mg | ORAL_CAPSULE | Freq: Two times a day (BID) | ORAL | 0 refills | Status: DC

## 2019-12-02 MED ORDER — NAPROXEN 500 MG PO TABS: 500.0000 mg | ORAL_TABLET | Freq: Three times a day (TID) | ORAL | 0 refills | Status: AC

## 2019-12-02 NOTE — ED Triage Notes (Signed)
To ED for eval of testicle pain. States he was treated for chlamydia a week ago. Still having pain. Told by a virtual visit md today that he needed to see urology. Pt states he didn't want to wait due to being told her could loose a testicle. States pain is better than a week ago. Slight abd pain. No fevers.

## 2019-12-02 NOTE — ED Notes (Signed)
Pt to u/s

## 2019-12-02 NOTE — ED Notes (Signed)
Urine Culture sent to Main Lab with UA 

## 2019-12-02 NOTE — ED Notes (Signed)
Waiting on rocephin to come from pharmacy

## 2019-12-02 NOTE — ED Provider Notes (Signed)
[name] EMERGENCY DEPARTMENT Provider Note   CSN: [id number] Arrival date & time: 12/02/19  1358     History Chief Complaint  Patient presents with  . Testicle Pain    Allen Wang is a 29 y.o. male presents to the ER for evaluation of testicular pain.  Onset several weeks ago.  Overall pain has slowly decreased and is intermittent, 1/10.  States is hard to tell which testicle is hurting, thinks it is both but hard to differentiate.  No modifying factors.  He went to urgent care for evaluation of this on 12/29 and tested positive for chlamydia.  He took azithromycin.  He had an ER visit with a provider who advised him to go to a specialist but states he does not have insurance.  He was told he could lose his testicles.  He is concerned about a torsion and would like an ultrasound.  Denies any current pain.  Denies any associated fever, nausea, vomiting.  No dysuria, hematuria, urinary frequency or urgency.  No penile discharge. No previous abdominal or hernia surgeries. No trauma. He has not been sexually active since recent diagnosis and treatment of chlamydia.     HPI     Past Medical History:  Diagnosis Date  . Chlamydia   . Paroxysmal atrial fibrillation [facility])     Patient Active Problem List   Diagnosis Date Noted  . PAF (paroxysmal atrial fibrillation) (HCC) 02/27/2016    History reviewed. No pertinent surgical history.     Family History  Problem Relation Age of Onset  . Diabetes Mother   . Hypertension Mother   . Diabetes Maternal Grandmother   . Depression Maternal Grandfather     Social History   Tobacco Use  . Smoking status: Former Smoker    Types: Cigars  . Smokeless tobacco: Never Used  Substance Use Topics  . Alcohol use: Yes    Comment: occasional  . Drug use: Yes    Types: Marijuana    Comment: none for 2 yrs.    Home Medications Prior to Admission medications   Medication Sig Start Date End Date Taking? Authorizing  Provider  doxycycline (VIBRAMYCIN) 100 MG capsule Take 1 capsule (100 mg total) by mouth 2 (two) times daily. 12/02/19   Liberty Handy, PA-C  naproxen (NAPROSYN) 500 MG tablet Take 1 tablet (500 mg total) by mouth 3 (three) times daily with meals for 5 days. 12/02/19 12/07/19  Liberty Handy, PA-C    Allergies    Patient has no known allergies.  Review of Systems   Review of Systems  Genitourinary: Positive for testicular pain.  All other systems reviewed and are negative.   Physical Exam Updated Vital Signs BP 134/71 (BP Location: Left Arm)   Pulse 79   Temp 98.5 F (36.9 C) (Oral)   Resp 16   SpO2 100%   Physical Exam Vitals and nursing note reviewed.  Constitutional:      General: He is not in acute distress.    Appearance: He is well-developed.     Comments: NAD.  HENT:     Head: Normocephalic and atraumatic.     Right Ear: External ear normal.     Left Ear: External ear normal.     Nose: Nose normal.  Eyes:     General: No scleral icterus.    Conjunctiva/sclera: Conjunctivae normal.  Cardiovascular:     Rate and Rhythm: Normal rate and regular rhythm.     Heart sounds:  Normal heart sounds. No murmur.  Pulmonary:     Effort: Pulmonary effort is normal.  Abdominal:     Palpations: Abdomen is soft.     Tenderness: There is no abdominal tenderness.  Genitourinary:    Comments:  External genitalia normal without erythema, edema, tenderness or lesions. Circumcised male.  No groin lymphadenopathy. No meatus discharge.  Glans and shaft smooth without tenderness, lesions, masses or deformity. Scrotum without lesions or edema.  Non tender testicles. Epididymis and spermatic cord without tenderness or masses, bilaterally. Cremasteric reflex intact. Musculoskeletal:        General: No deformity. Normal range of motion.     Cervical back: Normal range of motion and neck supple.  Skin:    General: Skin is warm and dry.     Capillary Refill: Capillary refill takes  less than 2 seconds.  Neurological:     Mental Status: He is alert and oriented to person, place, and time.  Psychiatric:        Behavior: Behavior normal.        Thought Content: Thought content normal.        Judgment: Judgment normal.     ED Results / Procedures / Treatments   Labs (all labs ordered are listed, but only abnormal results are displayed) Labs Reviewed  URINALYSIS, ROUTINE W REFLEX MICROSCOPIC    EKG None  Radiology US SCROTUM W/DOPPLER  Result Date: 12/02/2019 CLINICAL DATA:  29 year old male with bilateral testicular pain. EXAM: SCROTAL ULTRASOUND DOPPLER ULTRASOUND OF THE TESTICLES TECHNIQUE: Complete ultrasound examination of the testicles, epididymis, and other scrotal structures was performed. Color and spectral Doppler ultrasound were also utilized to evaluate blood flow to the testicles. COMPARISON:  None. FINDINGS: Right testicle Measurements: 4.1 x 2.2 x 2.7 cm. No mass or microlithiasis visualized. Left testicle Measurements: 4.3 x 2.1 x 2.8 cm. No mass or microlithiasis visualized. Right epididymis:  Normal in size and appearance. Left epididymis:  Normal in size and appearance. Hydrocele:  None visualized. Varicocele:  None visualized. Pulsed Doppler interrogation of both testes demonstrates normal low resistance arterial and venous waveforms bilaterally. IMPRESSION: Unremarkable testicular ultrasound. Electronically Signed   By: Anner Crete M.D.   On: 12/02/2019 19:41    Procedures Procedures (including critical care time)  Medications Ordered in ED Medications  cefTRIAXone (ROCEPHIN) injection 500 mg (has no administration in time range)  doxycycline (VIBRA-TABS) tablet 100 mg (has no administration in time range)    ED Course  I have reviewed the triage vital signs and the nursing notes.  Pertinent labs & imaging results that were available during my care of the patient were reviewed by me and considered in my medical decision making (see  chart for details).    MDM Rules/Calculators/A&P                      EMR reviewed. UC visit reviewed.   Highest suspicion for epididymitis/orchitis from recent chlamydia infection.  Low clinical suspicion for testicular torsion given chronicity of symptoms, mild pain and exam. Patient very concerned about this diagnosis however.  No signs of fournier's.  UA negative.  No inguinal or abdominal hernia noted. No trauma.   Will obtain US and plan to treat for epididymitis  [place] negative.  Will treat for epididymitis with rocephin, doxy, naproxen.  Recommended f/u with urology. Return precautions given. Patient in agreement with POC.   Final Clinical Impression(s) / ED Diagnoses Final diagnoses:  Pain in both testicles  Rx / DC Orders ED Discharge Orders         Ordered    doxycycline (VIBRAMYCIN) 100 MG capsule  2 times daily     12/02/19 2015    naproxen (NAPROSYN) 500 MG tablet  3 times daily with meals     12/02/19 2015           Liberty Handy, PA-C 12/02/19 2025    Rolan Bucco, MD 12/02/19 2111

## 2019-12-02 NOTE — ED Notes (Signed)
The pt is c/o testicle pain for one and one-half months. He had recent treatment for std  No bloody urine  A and o

## 2019-12-02 NOTE — Discharge Instructions (Addendum)
You were seen in the ER for testicular pain  Urine was normal.  Ultrasound was normal.  I suspect your symptoms and pain are from recent infection that caused inflammation in the epididymis.  This is treated with a shot of antibiotic that was given to you today and 10 days of doxycycline  Alternate naproxen 500 mg every 8 hours and/or acetaminophen 500 to 1000 mg every 6-8 hours for pain  Follow-up with urology if your pain continues  Return to the ER for increased sudden pain that is constant, difficulty urinating, fevers or abdominal pain

## 2020-02-22 ENCOUNTER — Other Ambulatory Visit: Payer: Self-pay | Admitting: Urology

## 2020-02-22 ENCOUNTER — Encounter: Payer: Self-pay | Admitting: Urology

## 2020-02-22 ENCOUNTER — Ambulatory Visit (INDEPENDENT_AMBULATORY_CARE_PROVIDER_SITE_OTHER): Payer: Self-pay | Admitting: Urology

## 2020-02-22 ENCOUNTER — Other Ambulatory Visit: Payer: Self-pay

## 2020-02-22 VITALS — BP 145/84 | HR 69 | Ht 65.0 in | Wt 143.0 lb

## 2020-02-22 DIAGNOSIS — R300 Dysuria: Secondary | ICD-10-CM

## 2020-02-22 DIAGNOSIS — A64 Unspecified sexually transmitted disease: Secondary | ICD-10-CM

## 2020-02-22 DIAGNOSIS — R102 Pelvic and perineal pain: Secondary | ICD-10-CM

## 2020-02-22 DIAGNOSIS — N50819 Testicular pain, unspecified: Secondary | ICD-10-CM

## 2020-02-22 MED ORDER — CELECOXIB 100 MG PO CAPS: 100.0000 mg | ORAL_CAPSULE | Freq: Every day | ORAL | 0 refills | Status: AC

## 2020-02-22 NOTE — Progress Notes (Signed)
02/22/20 9:25 AM   Michela Pitcher 1991/02/24 [id number]  CC: Pelvic pain, STD, dysuria  HPI: I saw Mr. Bigley in urology clinic for the above issues.  He is a healthy 29 year old male with history of chlamydia in November 2020 who presents with scrotal and pelvic pain, and concern for recurrent STDs.  He reports some new penile lesions that he is concerned may be herpes.  He was told by an urgent care provider that this was herpes, however he was never tested.  He was seen in the ER on 12/02/2019 with primarily right-sided testicular pain and a scrotal ultrasound was benign.  He was empirically treated with doxycycline for possible epididymitis.  He reports moderate improvement in his symptoms since that time and is only having some residual occasional mild right-sided testicular pain and some bladder pain.  He reports that snug fitting underwear has significantly improved his discomfort.  He is also having some dysuria and some leakage of a mucousy discharge from the meatus.  He denies any gross hematuria.  Urinalysis today 6-10 WBCs, 0-2 RBCs, mucus present, no bacteria, no yeast, no trichomonas, nitrite negative, no leukocytes.   PMH: Past Medical History:  Diagnosis Date  . Chlamydia   . Paroxysmal atrial fibrillation (HCC)   . STD (sexually transmitted disease)     Surgical History: No past surgical history on file.  Family History: Family History  Problem Relation Age of Onset  . Diabetes Mother   . Hypertension Mother   . Diabetes Maternal Grandmother   . Depression Maternal Grandfather     Social History:  reports that he has quit smoking. His smoking use included cigars. He has never used smokeless tobacco. He reports current alcohol use. He reports current drug use. Drug: Marijuana.  Physical Exam: BP (!) 145/84   Pulse 69   Ht 5\' 5"  (1.651 m)   Wt 143 lb (64.9 kg)   BMI 23.80 kg/m    Constitutional:  Alert and oriented, No acute distress. Cardiovascular: No  clubbing, cyanosis, or edema. Respiratory: Normal respiratory effort, no increased work of breathing. GI: Abdomen is soft, nontender, nondistended, no abdominal masses GU: Phallus with patent meatus, no discharge or lesions at the meatus.  There are 3 total penile lesions, 2 at the right base and lateral aspect, as well as a smaller third lesion at the left mid shaft.  The right-sided base lesion has some purulent discharge.  The lesions are partially scabbed over. Lymph: No cervical or inguinal lymphadenopathy.   Laboratory Data: Reviewed, see HPI  Pertinent Imaging: I personally reviewed the scrotal ultrasound that shows no abnormalities  Assessment & Plan:   In summary, he is a 29 year old male with a history of chlamydia as well as scrotal pain, pelvic pain, and dysuria who presents with concern for recurrent STD.  We discussed possible STDs and their etiologies and treatments at length, and we reviewed the CDC guidelines recommending work-up and treatment.  I also recommended HIV testing with his history of STDs and he is agreeable.  We discussed the importance of protected sex.  -STD testing including HIV, syphilis, herpes, G/C, call with results -Urine for culture -Trial of NSAIDs x2 weeks for residual pelvic pain -Consider cystoscopy in the future if refractory symptoms to rule out urethral stricture  I spent 45 total minutes on the day of the encounter including pre-visit review of the medical record, face-to-face time with the patient, and post visit ordering of labs/imaging/tests.  34, MD 02/22/2020  [place] [address] [place], [place] [address] [phone]

## 2020-02-23 LAB — URINALYSIS, COMPLETE
Bilirubin, UA: NEGATIVE
Glucose, UA: NEGATIVE
Ketones, UA: NEGATIVE
Leukocytes,UA: NEGATIVE
Nitrite, UA: NEGATIVE
Protein,UA: NEGATIVE
RBC, UA: NEGATIVE
Specific Gravity, UA: 1.025 (ref 1.005–1.030)
Urobilinogen, Ur: 0.2 mg/dL (ref 0.2–1.0)
pH, UA: 7 (ref 5.0–7.5)

## 2020-02-23 LAB — HIV ANTIBODY (ROUTINE TESTING W REFLEX): HIV Screen 4th Generation wRfx: NONREACTIVE

## 2020-02-23 LAB — RPR QUALITATIVE: RPR Ser Ql: NONREACTIVE

## 2020-02-23 LAB — MICROSCOPIC EXAMINATION: Bacteria, UA: NONE SEEN

## 2020-02-23 LAB — HEPATITIS B SURFACE ANTIGEN: Hepatitis B Surface Ag: NEGATIVE

## 2020-02-24 ENCOUNTER — Other Ambulatory Visit: Payer: Self-pay | Admitting: Urology

## 2020-02-24 ENCOUNTER — Telehealth: Payer: Self-pay | Admitting: Urology

## 2020-02-24 DIAGNOSIS — A64 Unspecified sexually transmitted disease: Secondary | ICD-10-CM

## 2020-02-24 LAB — GC/CHLAMYDIA PROBE AMP
Chlamydia trachomatis, NAA: POSITIVE — AB
Neisseria Gonorrhoeae by PCR: NEGATIVE

## 2020-02-24 MED ORDER — LEVOFLOXACIN 500 MG PO TABS: 500.0000 mg | ORAL_TABLET | Freq: Every day | ORAL | 0 refills | Status: AC

## 2020-02-24 NOTE — Telephone Encounter (Signed)
I reviewed his STD results: positive for chlamydia, all others negative, herpes still pending.  Has previously failed azithromycin and doxycycline treatment for chlamydia, I recommended a 7-day course of Levaquin per CDC guidelines.  Risks and benefits discussed.  Legrand Rams, MD 02/24/2020

## 2020-02-25 LAB — CULTURE, URINE COMPREHENSIVE

## 2020-02-25 LAB — HERPES SIMPLEX VIRUS CULTURE

## 2020-03-06 ENCOUNTER — Telehealth: Payer: Self-pay

## 2020-03-06 NOTE — Telephone Encounter (Signed)
-----   Message from Sondra Come, MD sent at 03/06/2020  9:09 AM EDT ----- Herpes testing negative, follow up as needed  Legrand Rams, MD 03/06/2020

## 2020-03-06 NOTE — Telephone Encounter (Signed)
Called pt informed him of the information below. Pt gave verbal understanding. Pt c/o some scrotal discomfort encouraged pt to keep upcoming appointment next week.

## 2020-03-13 ENCOUNTER — Ambulatory Visit (INDEPENDENT_AMBULATORY_CARE_PROVIDER_SITE_OTHER): Payer: Self-pay | Admitting: Urology

## 2020-03-13 ENCOUNTER — Other Ambulatory Visit: Payer: Self-pay

## 2020-03-13 ENCOUNTER — Encounter: Payer: Self-pay | Admitting: Urology

## 2020-03-13 ENCOUNTER — Other Ambulatory Visit
Admission: RE | Admit: 2020-03-13 | Discharge: 2020-03-13 | Disposition: A | Discharge status: Home / Self Care | Payer: Self-pay | Attending: Urology | Admitting: Urology

## 2020-03-13 VITALS — BP 135/77 | HR 72 | Ht 67.0 in | Wt 147.0 lb

## 2020-03-13 DIAGNOSIS — Z8619 Personal history of other infectious and parasitic diseases: Secondary | ICD-10-CM

## 2020-03-13 DIAGNOSIS — R102 Pelvic and perineal pain: Secondary | ICD-10-CM

## 2020-03-13 LAB — CHLAMYDIA/NGC RT PCR (ARMC ONLY)
Chlamydia Tr: NOT DETECTED
N gonorrhoeae: NOT DETECTED

## 2020-03-13 MED ORDER — CELECOXIB 100 MG PO CAPS: 100.0000 mg | ORAL_CAPSULE | Freq: Every day | ORAL | 0 refills | Status: AC

## 2020-03-13 NOTE — Progress Notes (Signed)
   03/13/2020 8:58 AM   Allen Wang 02-21-1991 [id number]  Reason for visit: Follow up STD testing and pelvic pain  HPI: I saw Allen Wang back in urology clinic at today.  He is a 29 year old healthy male with a history of chlamydia with scrotal pain, pelvic pain, and dysuria who underwent STD testing on 02/22/2020.  This was all negative aside from chlamydia, and he was treated with 7-day course of Levaquin.  He reports complete resolution of his urinary symptoms of dysuria and discharge, however he has had some persistent pelvic pain.  This seems to be worse when he is lifting heavy boxes at work.  He denies any weak stream, split stream, or gross hematuria.  He would like to be retested for chlamydia today.  I had a long conversation with the patient that often patients who have a severe UTI or STD can have a few weeks to months of persistent pelvic pain likely related to inflammation/pelvic floor dysfunction/muscle spasm/pain.  I counseled him extensively that I anticipate this will improve spontaneously.  Re-send chlamydia today, call with results 2 weeks Celebrex 100 mg daily Continue snug fitting underwear, icing as needed, avoid exacerbating factors Follow-up as needed, discussed return precautions at length   I spent 20 total minutes on the day of the encounter including pre-visit review of the medical record, face-to-face time with the patient, and post visit ordering of labs/imaging/tests.  Sondra Come, MD  [facility] [address], [address], [place] [address] [phone]

## 2020-03-14 ENCOUNTER — Telehealth: Payer: Self-pay

## 2020-03-14 NOTE — Telephone Encounter (Signed)
-----   Message from Sondra Come, MD sent at 03/13/2020  3:39 PM EDT ----- Chlamydia/gonorrhea negative, call us if pelvic pain not improving over next few weeks  Thanks Legrand Rams, MD 03/13/2020

## 2020-03-14 NOTE — Telephone Encounter (Signed)
Called pt informed, no answer. LM for pt informing him of negative results, advised pt to call back for questions or concerns.

## 2020-03-17 ENCOUNTER — Ambulatory Visit: Payer: Self-pay | Attending: Internal Medicine

## 2020-03-17 DIAGNOSIS — Z23 Encounter for immunization: Secondary | ICD-10-CM

## 2020-03-17 NOTE — Progress Notes (Signed)
   Covid-19 Vaccination Clinic  Name:  TEVEN MITTMAN    MRN: [id number] DOB: [birth year]  03/17/2020  Mr. Lapaglia was observed post Covid-19 immunization for 15 minutes without incident. He was provided with Vaccine Information Sheet and instruction to access the V-Safe system.   Mr. Jirak was instructed to call 911 with any severe reactions post vaccine: Marland Kitchen Difficulty breathing  . Swelling of face and throat  . A fast heartbeat  . A bad rash all over body  . Dizziness and weakness   Immunizations Administered    Name Date Dose VIS Date Route   Pfizer COVID-19 Vaccine 03/17/2020 11:05 AM 0.3 mL 01/18/2019 Intramuscular   Manufacturer: [employer], [employer]   Lot: [id number]   NDC: [address]-5

## 2020-04-09 ENCOUNTER — Ambulatory Visit: Payer: Self-pay | Attending: Internal Medicine

## 2020-04-09 DIAGNOSIS — Z23 Encounter for immunization: Secondary | ICD-10-CM

## 2020-04-09 NOTE — Progress Notes (Signed)
   Covid-19 Vaccination Clinic  Name:  Allen Wang    MRN: [id number] DOB: 08/19/1991  04/09/2020  Allen Wang was observed post Covid-19 immunization for 15 minutes without incident. He was provided with Vaccine Information Sheet and instruction to access the V-Safe system.   Allen Wang was instructed to call 911 with any severe reactions post vaccine: Marland Kitchen Difficulty breathing  . Swelling of face and throat  . A fast heartbeat  . A bad rash all over body  . Dizziness and weakness   Immunizations Administered    Name Date Dose VIS Date Route   Pfizer COVID-19 Vaccine 04/09/2020 10:13 AM 0.3 mL 01/18/2019 Intramuscular   Manufacturer: [employer], [employer]   Lot: [id number]   NDC: [address]-6

## 2021-03-08 ENCOUNTER — Ambulatory Visit
Admission: EM | Admit: 2021-03-08 | Discharge: 2021-03-08 | Disposition: A | Discharge status: Home / Self Care | Payer: Self-pay | Attending: Emergency Medicine | Admitting: Emergency Medicine

## 2021-03-08 ENCOUNTER — Ambulatory Visit: Payer: Self-pay

## 2021-03-08 ENCOUNTER — Other Ambulatory Visit: Payer: Self-pay

## 2021-03-08 ENCOUNTER — Encounter: Payer: Self-pay | Admitting: Emergency Medicine

## 2021-03-08 DIAGNOSIS — Z202 Contact with and (suspected) exposure to infections with a predominantly sexual mode of transmission: Secondary | ICD-10-CM | POA: Insufficient documentation

## 2021-03-08 NOTE — ED Provider Notes (Signed)
[facility]   [id number] 03/08/21 Arrival Time: 1302   EF:EOFHQRF FOR STD  SUBJECTIVE:  Allen Wang is a 30 y.o. male who presents requesting STI screening.  Currently asymptomatic.  Partner asymptomatic.  Last unprotected sexual encounter few days ago.  Sexually active with 2 male partners over the past 6 months.  Reports hx of gonorrhea and chlamydia, was symptomatic at that time.  Denies fever, chills, nausea, vomiting, abdominal or pelvic pain, penile rashes or lesions, testicular swelling or pain.      No LMP for male patient.  ROS: As per HPI.  All other pertinent ROS negative.     Past Medical History:  Diagnosis Date  . Chlamydia   . Paroxysmal atrial fibrillation (HCC)   . STD (sexually transmitted disease)    History reviewed. No pertinent surgical history. No Known Allergies No current facility-administered medications on file prior to encounter.   Current Outpatient Medications on File Prior to Encounter  Medication Sig Dispense Refill  . celecoxib (CELEBREX) 100 MG capsule Take 1 capsule (100 mg total) by mouth daily. Take with food 14 capsule 0  . celecoxib (CELEBREX) 100 MG capsule Take 1 capsule (100 mg total) by mouth daily. 14 capsule 0   Social History   Socioeconomic History  . Marital status: Single    Spouse name: Not on file  . Number of children: Not on file  . Years of education: Not on file  . Highest education level: Not on file  Occupational History  . Not on file  Tobacco Use  . Smoking status: Current Every Day Smoker    Packs/day: 0.50    Types: Cigarettes  . Smokeless tobacco: Never Used  Substance and Sexual Activity  . Alcohol use: Yes    Comment: occasional  . Drug use: Not Currently    Types: Marijuana  . Sexual activity: Yes  Other Topics Concern  . Not on file  Social History Narrative   Marital status: single; dates females only.      Children: none      Lives: with parents.      Employment: [employer]; Product/process development scientist; likes job; 40 hours per week.      Tobacco: none       Alcohol: weekends; no DWIs.       Drugs: none       Sexual activity:  Age 89; total partners = 5; no history of STDs; condoms 100%.      Seatbelts:  100%      Guns: none         Social Determinants of Corporate investment banker Strain: Not on file  Food Insecurity: Not on file  Transportation Needs: Not on file  Physical Activity: Not on file  Stress: Not on file  Social Connections: Not on file  Intimate Partner Violence: Not on file   Family History  Problem Relation Age of Onset  . Diabetes Mother   . Hypertension Mother   . Diabetes Maternal Grandmother   . Depression Maternal Grandfather     OBJECTIVE:  Vitals:   03/08/21 1445  BP: 128/79  Pulse: 77  Resp: 16  Temp: 98.3 F (36.8 C)  TempSrc: Oral  SpO2: 96%     General appearance: alert, NAD, appears stated age Head: NCAT Throat: lips, mucosa, and tongue normal; teeth and gums normal Lungs: CTA bilaterally without adventitious breath sounds Heart: regular rate and rhythm.  Back: no CVA tenderness Abdomen: soft, non-tender; bowel sounds  normal; no guarding GU: deferred Skin: warm and dry Psychological:  Alert and cooperative. Normal mood and affect.  LABS:  Results for orders placed or performed in visit on 02/22/20  HIV Antibody (routine testing w rflx)  Result Value Ref Range   HIV Screen 4th Generation wRfx Non Reactive Non Reactive  RPR Qual  Result Value Ref Range   RPR Ser Ql Non Reactive Non Reactive  Hepatitis B surface antigen  Result Value Ref Range   Hepatitis B Surface Ag Negative Negative    Labs Reviewed  HIV ANTIBODY (ROUTINE TESTING W REFLEX)  RPR  CYTOLOGY, (ORAL, ANAL, URETHRAL) ANCILLARY ONLY    ASSESSMENT & PLAN:  1. Possible exposure to STD     No orders of the defined types were placed in this encounter.   Pending: Labs Reviewed  HIV ANTIBODY (ROUTINE TESTING W REFLEX)  RPR   CYTOLOGY, (ORAL, ANAL, URETHRAL) ANCILLARY ONLY    Urethral swab obtained HIV/ syphilis testing today We will follow up with you regarding the results of your test If tests are positive, please abstain from sexual activity until you and your partner(s) are treated Follow up with PCP as needed Return here or go to ER if you have any new or worsening symptoms    Reviewed expectations re: course of current medical issues. Questions answered. Outlined signs and symptoms indicating need for more acute intervention. Patient verbalized understanding. After Visit Summary given.       Rennis Harding, PA-C 03/08/21 1513

## 2021-03-08 NOTE — Discharge Instructions (Signed)
Urethral swab obtained HIV/ syphilis testing today We will follow up with you regarding the results of your test If tests are positive, please abstain from sexual activity until you and your partner(s) are treated Follow up with PCP as needed Return here or go to ER if you have any new or worsening symptoms

## 2021-03-08 NOTE — ED Triage Notes (Signed)
Would like to be screen for all std's .  Denies any s/s

## 2021-03-09 LAB — RPR: RPR Ser Ql: NONREACTIVE

## 2021-03-09 LAB — HIV ANTIBODY (ROUTINE TESTING W REFLEX): HIV Screen 4th Generation wRfx: NONREACTIVE

## 2021-03-11 LAB — CYTOLOGY, (ORAL, ANAL, URETHRAL) ANCILLARY ONLY
Chlamydia: NEGATIVE
Comment: NEGATIVE
Comment: NEGATIVE
Comment: NORMAL
Neisseria Gonorrhea: NEGATIVE
Trichomonas: NEGATIVE

## 2021-05-12 IMAGING — US US SCROTUM W/ DOPPLER COMPLETE
1 series · 14 of 25 positions shown · non-contrast
Comparison: None.

CLINICAL DATA: 28-year-old male with bilateral testicular pain.

EXAM:
SCROTAL ULTRASOUND
DOPPLER ULTRASOUND OF THE TESTICLES
TECHNIQUE: Complete ultrasound examination of the testicles, epididymis, and
other scrotal structures was performed. Color and spectral Doppler
ultrasound were also utilized to evaluate blood flow to the
testicles.

[Series 1: us scrotum w/ doppler complete · 14 of 54 slices shown]
[im 1/54]
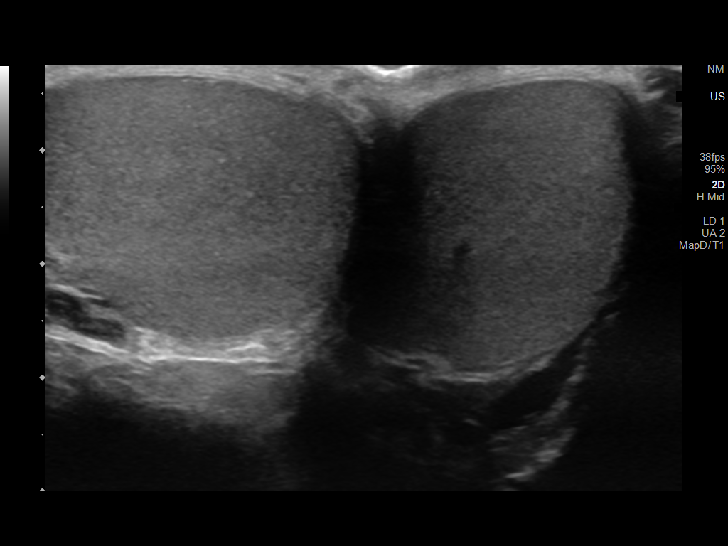
[im 5/54]
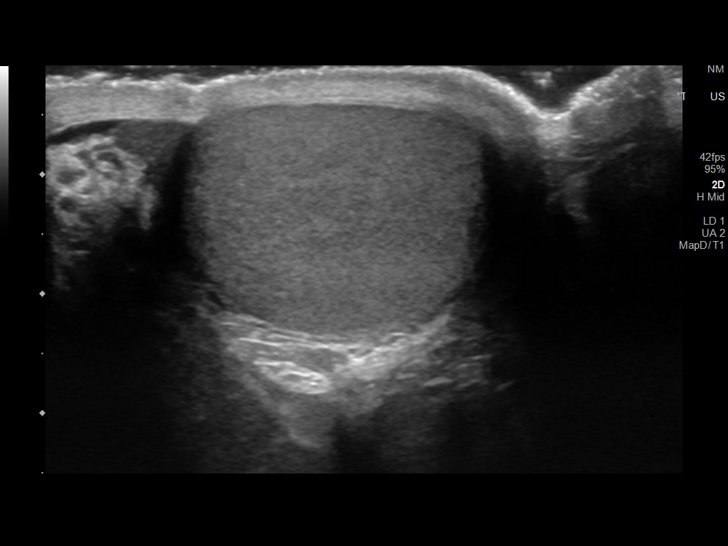
[im 9/54]
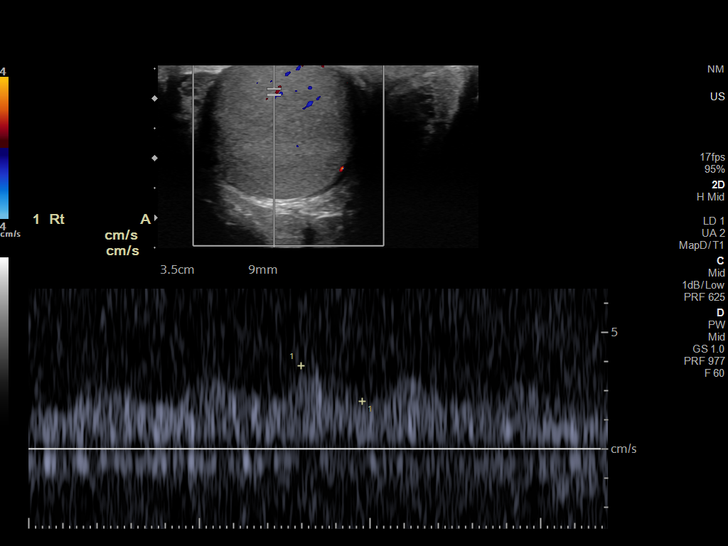
[im 14/54]
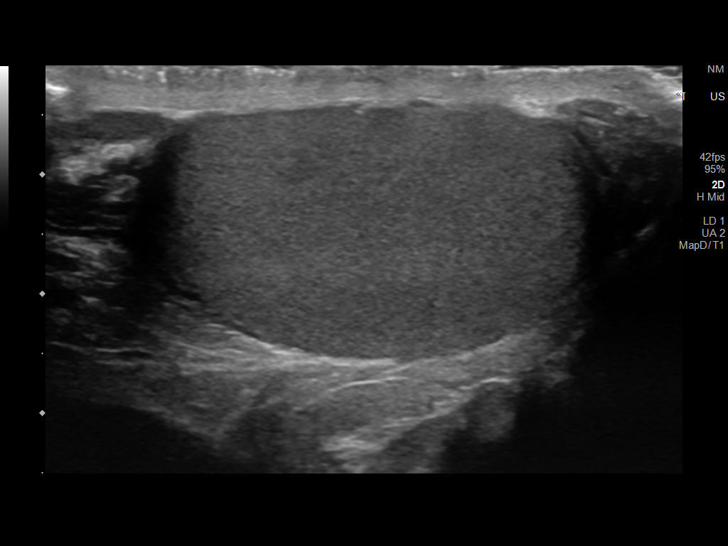
[im 18/54]
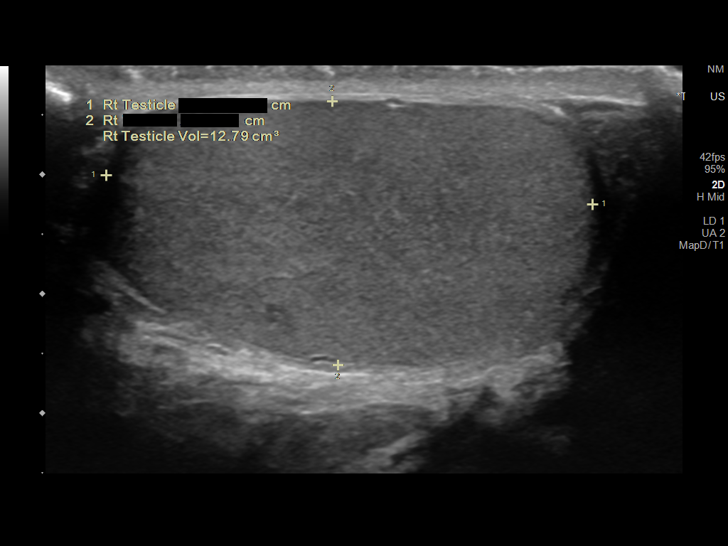
[im 20/54]
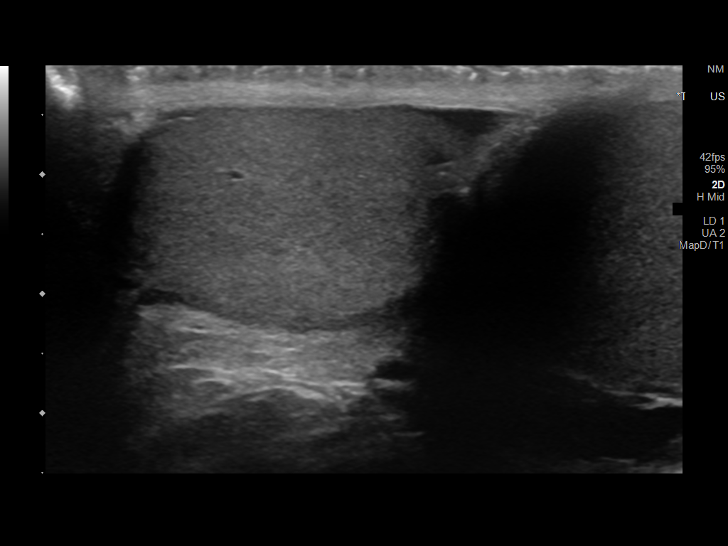
[im 25/54]
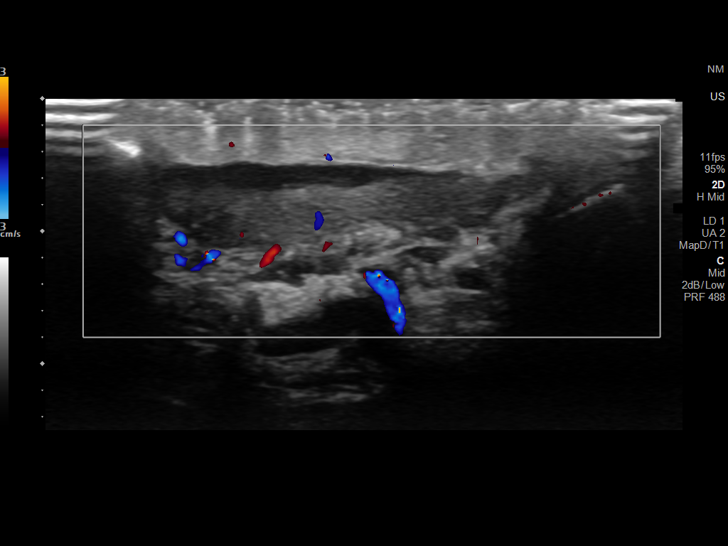
[im 29/54]
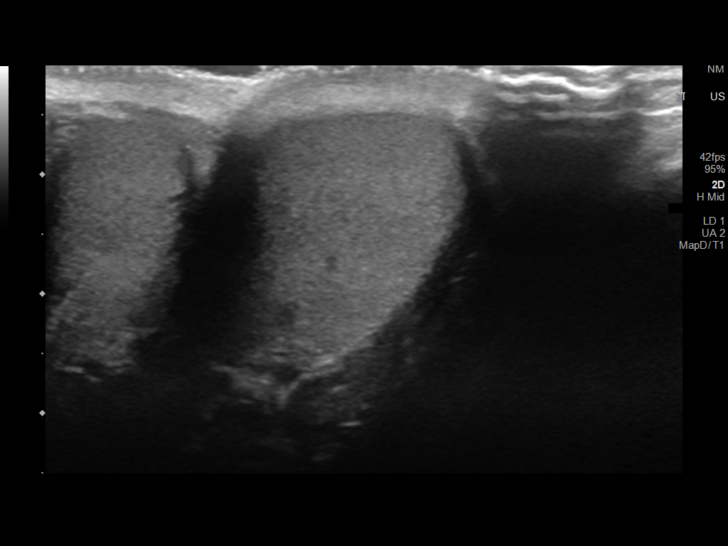
[im 34/54]
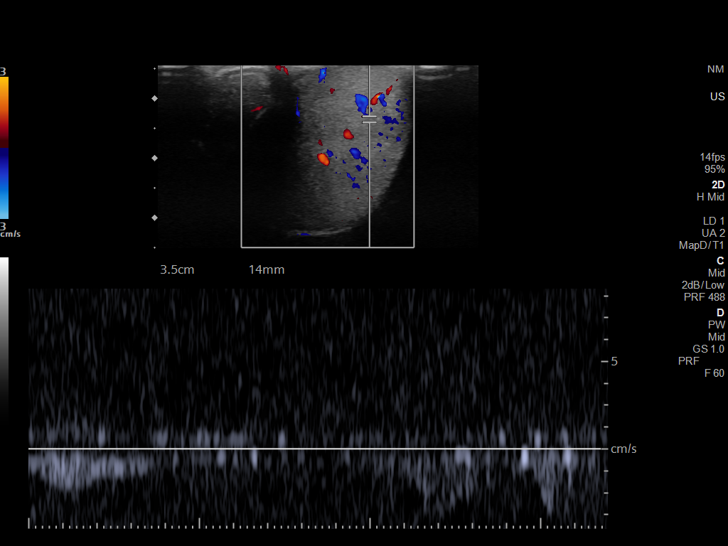
[im 36/54]
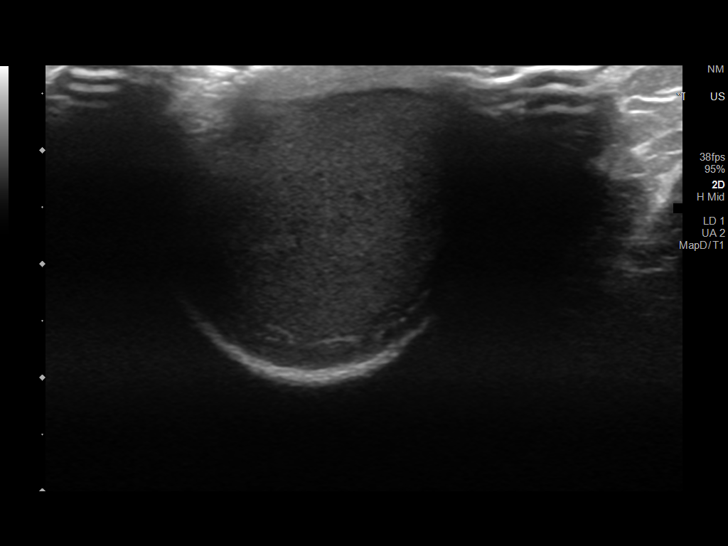
[im 40/54]
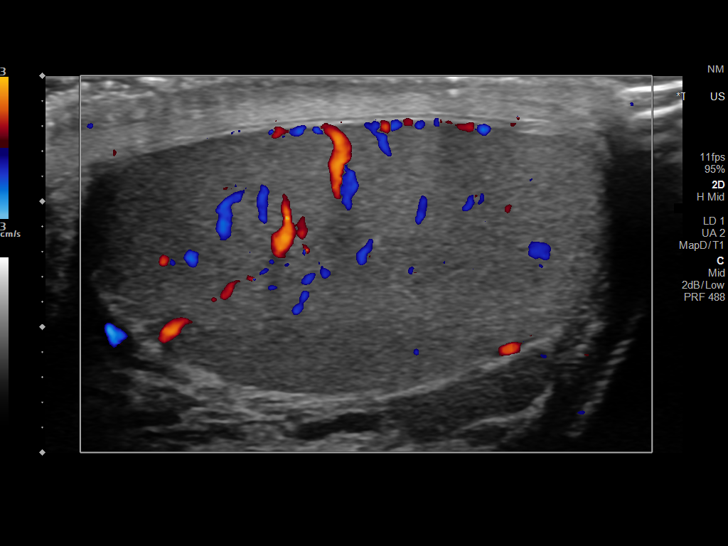
[im 45/54]
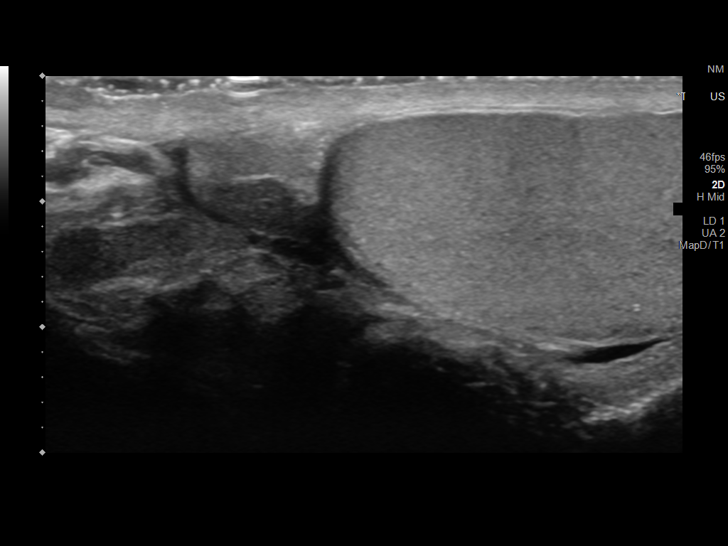
[im 49/54]
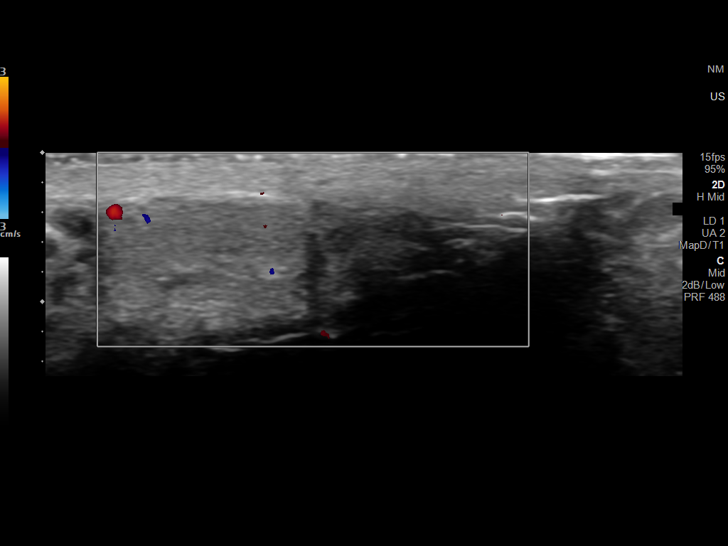
[im 54/54]
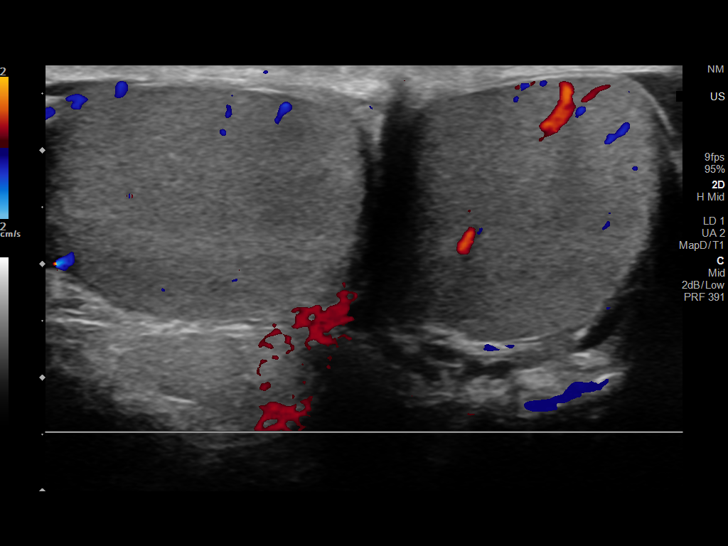

[14 of 25 positions shown; findings below may reference images not displayed]

FINDINGS: Right testicle

Measurements: 4.1 x 2.2 x 2.7 cm. No mass or microlithiasis
visualized.

Left testicle

Measurements: 4.3 x 2.1 x 2.8 cm. No mass or microlithiasis
visualized.

Right epididymis:  Normal in size and appearance.

Left epididymis:  Normal in size and appearance.

Hydrocele:  None visualized.

Varicocele:  None visualized.

Pulsed Doppler interrogation of both testes demonstrates normal low
resistance arterial and venous waveforms bilaterally.
IMPRESSION: Unremarkable testicular ultrasound.

## 2021-10-14 ENCOUNTER — Ambulatory Visit: Admit: 2021-10-14 | Disposition: A | Discharge status: Home / Self Care | Payer: Self-pay

## 2021-10-16 ENCOUNTER — Ambulatory Visit: Payer: Self-pay

## 2021-10-25 ENCOUNTER — Other Ambulatory Visit: Payer: Self-pay

## 2021-10-25 ENCOUNTER — Ambulatory Visit
Admission: RE | Admit: 2021-10-25 | Discharge: 2021-10-25 | Disposition: A | Discharge status: Home / Self Care | Payer: Self-pay | Source: Ambulatory Visit | Attending: Internal Medicine | Admitting: Internal Medicine

## 2021-10-25 VITALS — BP 145/86 | HR 78 | Temp 98.0°F | Resp 18

## 2021-10-25 DIAGNOSIS — Z202 Contact with and (suspected) exposure to infections with a predominantly sexual mode of transmission: Secondary | ICD-10-CM | POA: Insufficient documentation

## 2021-10-25 DIAGNOSIS — R300 Dysuria: Secondary | ICD-10-CM | POA: Insufficient documentation

## 2021-10-25 DIAGNOSIS — Z113 Encounter for screening for infections with a predominantly sexual mode of transmission: Secondary | ICD-10-CM | POA: Insufficient documentation

## 2021-10-25 LAB — POCT URINALYSIS DIP (MANUAL ENTRY)
Bilirubin, UA: NEGATIVE
Glucose, UA: NEGATIVE mg/dL
Ketones, POC UA: NEGATIVE mg/dL
Leukocytes, UA: NEGATIVE
Nitrite, UA: NEGATIVE
Spec Grav, UA: 1.025 (ref 1.010–1.025)
Urobilinogen, UA: 0.2 E.U./dL
pH, UA: 6.5 (ref 5.0–8.0)

## 2021-10-25 MED ORDER — METRONIDAZOLE 500 MG PO TABS
2000.0000 mg | ORAL_TABLET | Freq: Once | ORAL | Status: AC
  Administered 2021-10-25: 2000 mg via ORAL

## 2021-10-25 NOTE — ED Provider Notes (Signed)
[facility]    CSN: [id number] Arrival date & time: 10/25/21  0844      History   Chief Complaint Chief Complaint  Patient presents with   Body Fluid Exposure   sti check    HPI Allen Wang is a 30 y.o. male.   Patient presents for further evaluation after being exposed to trichomonas from a sexual partner about 1.5 months ago.  Patient reports that his symptoms started approximately 3 weeks after his exposure.  Patient is currently having some very mild bilateral testicular pain, mild lower abdominal pain, dysuria.  Denies any penile discharge, lesions to penis, urinary frequency.  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  Patient denies any fevers, body aches, chills.   Body Fluid Exposure  Past Medical History:  Diagnosis Date   Chlamydia    Paroxysmal atrial fibrillation (HCC)    STD (sexually transmitted disease)     Patient Active Problem List   Diagnosis Date Noted   PAF (paroxysmal atrial fibrillation) (HCC) 02/27/2016    History reviewed. No pertinent surgical history.     Home Medications    Prior to Admission medications   Medication Sig Start Date End Date Taking? Authorizing Provider  celecoxib (CELEBREX) 100 MG capsule Take 1 capsule (100 mg total) by mouth daily. Take with food 02/22/20   Sondra Come, MD  celecoxib (CELEBREX) 100 MG capsule Take 1 capsule (100 mg total) by mouth daily. 03/13/20   Sondra Come, MD    Family History Family History  Problem Relation Age of Onset   Diabetes Mother    Hypertension Mother    Diabetes Maternal Grandmother    Depression Maternal Grandfather     Social History Social History   Tobacco Use   Smoking status: Every Day    Packs/day: 0.50    Types: Cigarettes   Smokeless tobacco: Never  Substance Use Topics   Alcohol use: Yes    Comment: occasional   Drug use: Not Currently    Types: Marijuana     Allergies    Patient has no known allergies.   Review of Systems Review of Systems Per HPI  Physical Exam Triage Vital Signs ED Triage Vitals  Enc Vitals Group     BP 10/25/21 0854 (!) 145/86     Pulse Rate 10/25/21 0854 78     Resp 10/25/21 0854 18     Temp 10/25/21 0854 98 F (36.7 C)     Temp Source 10/25/21 0854 Oral     SpO2 10/25/21 0854 98 %     Weight --      Height --      Head Circumference --      Peak Flow --      Pain Score 10/25/21 0855 0     Pain Loc --      Pain Edu? --      Excl. in GC? --    No data found.  Updated Vital Signs BP (!) 145/86 (BP Location: Left Arm)   Pulse 78   Temp 98 F (36.7 C) (Oral)   Resp 18   SpO2 98%   Visual Acuity Right Eye Distance:   Left Eye Distance:   Bilateral Distance:    Right Eye Near:   Left Eye Near:    Bilateral Near:     Physical Exam Exam conducted with a chaperone present.  Constitutional:  General: He is not in acute distress.    Appearance: Normal appearance. He is not toxic-appearing or diaphoretic.  HENT:     Head: Normocephalic and atraumatic.  Eyes:     Extraocular Movements: Extraocular movements intact.     Conjunctiva/sclera: Conjunctivae normal.  Pulmonary:     Effort: Pulmonary effort is normal.  Genitourinary:    Pubic Area: No rash.      Penis: Normal and circumcised. No erythema, tenderness, discharge, swelling or lesions.      Testes: Normal. Cremasteric reflex is present.        Right: Mass, tenderness or swelling not present.        Left: Mass, tenderness or swelling not present.     Epididymis:     Right: Normal.     Left: Normal.  Neurological:     General: No focal deficit present.     Mental Status: He is alert and oriented to person, place, and time. Mental status is at baseline.  Psychiatric:        Mood and Affect: Mood normal.        Behavior: Behavior normal.        Thought Content: Thought content normal.        Judgment: Judgment normal.     UC Treatments /  Results  Labs (all labs ordered are listed, but only abnormal results are displayed) Labs Reviewed  POCT URINALYSIS DIP (MANUAL ENTRY) - Abnormal; Notable for the following components:      Result Value   Blood, UA trace-intact (*)    Protein Ur, POC trace (*)    All other components within normal limits  URINE CULTURE  CYTOLOGY, (ORAL, ANAL, URETHRAL) ANCILLARY ONLY    EKG   Radiology No results found.  Procedures Procedures (including critical care time)  Medications Ordered in UC Medications  metroNIDAZOLE (FLAGYL) tablet 2,000 mg (2,000 mg Oral Given 10/25/21 0924)    Initial Impression / Assessment and Plan / UC Course  I have reviewed the triage vital signs and the nursing notes.  Pertinent labs & imaging results that were available during my care of the patient were reviewed by me and considered in my medical decision making (see chart for details).     Suspect the patient symptoms are due to trichomoniasis due to recent exposure.  Will treat with 2 g metronidazole in urgent care today.  Cytology swab pending.  Urinalysis not definitive for any urinary tract infection.  Urine culture is pending.  Patient advised to refrain from any sexual activity until test results and treatment are complete.  No signs of severe infection on exam.  Do not think that patient is in immediate need of medical attention at the hospital at this time or in need of testicular ultrasound.  No signs of testicular torsion or worrisome etiologies on exam given that physical exam is benign.  Discussed strict return precautions.  Patient advised to follow-up with urology due to history of severe STD infections.  Patient voiced understanding and was agreeable with plan. Final Clinical Impressions(s) / UC Diagnoses   Final diagnoses:  Exposure to trichomonas  Screening examination for venereal disease  Dysuria     Discharge Instructions      You were treated for trichomonas exposure with  metronidazole antibiotic in urgent care today.  Please refrain from sexual activity until test results and treatment are complete.  Your penile swab and urine culture pending.  We will call if they are positive.  Please  follow-up with urology if symptoms become severe or persistent.    ED Prescriptions   None    PDMP not reviewed this encounter.   Gustavus Bryant, [place] 10/25/21 [phone]

## 2021-10-25 NOTE — Discharge Instructions (Signed)
You were treated for trichomonas exposure with metronidazole antibiotic in urgent care today.  Please refrain from sexual activity until test results and treatment are complete.  Your penile swab and urine culture pending.  We will call if they are positive.  Please follow-up with urology if symptoms become severe or persistent.

## 2021-10-25 NOTE — ED Triage Notes (Signed)
Pt states he had an exposure to trichomonas after being called from a male sex partner. States was informed about 1.5 months ago.   C/o burning during urination, lower pelvic pain, bilat testicular pain, more than usual amount while voiding.   Denies discharge, hematuria, lower back pain,

## 2021-10-26 LAB — URINE CULTURE: Culture: NO GROWTH

## 2021-10-28 LAB — CYTOLOGY, (ORAL, ANAL, URETHRAL) ANCILLARY ONLY
Chlamydia: NEGATIVE
Comment: NEGATIVE
Comment: NEGATIVE
Comment: NORMAL
Neisseria Gonorrhea: NEGATIVE
Trichomonas: NEGATIVE

## 2021-11-15 ENCOUNTER — Ambulatory Visit
Admission: RE | Admit: 2021-11-15 | Discharge: 2021-11-15 | Disposition: A | Discharge status: Home / Self Care | Payer: Self-pay | Source: Ambulatory Visit | Attending: Physician Assistant | Admitting: Physician Assistant

## 2021-11-15 ENCOUNTER — Other Ambulatory Visit: Payer: Self-pay

## 2021-11-15 VITALS — BP 130/87 | HR 84 | Temp 98.4°F | Resp 18

## 2021-11-15 DIAGNOSIS — Z113 Encounter for screening for infections with a predominantly sexual mode of transmission: Secondary | ICD-10-CM | POA: Insufficient documentation

## 2021-11-15 NOTE — ED Provider Notes (Signed)
[facility]    CSN: [id number] Arrival date & time: 11/15/21  1251      History   Chief Complaint Chief Complaint  Patient presents with   STD testing   1p appointment    HPI Allen Wang is a 30 y.o. male.   Patient here today for STD screening.  He denies any current symptoms.  He stated he was treated for trichomoniasis at the beginning of this month and just wants screening to ensure clearance.  The history is provided by the patient.   Past Medical History:  Diagnosis Date   Chlamydia    Paroxysmal atrial fibrillation (HCC)    STD (sexually transmitted disease)     Patient Active Problem List   Diagnosis Date Noted   PAF (paroxysmal atrial fibrillation) (HCC) 02/27/2016    History reviewed. No pertinent surgical history.     Home Medications    Prior to Admission medications   Medication Sig Start Date End Date Taking? Authorizing Provider  celecoxib (CELEBREX) 100 MG capsule Take 1 capsule (100 mg total) by mouth daily. Take with food 02/22/20   Sondra Come, MD  celecoxib (CELEBREX) 100 MG capsule Take 1 capsule (100 mg total) by mouth daily. 03/13/20   Sondra Come, MD    Family History Family History  Problem Relation Age of Onset   Diabetes Mother    Hypertension Mother    Diabetes Maternal Grandmother    Depression Maternal Grandfather     Social History Social History   Tobacco Use   Smoking status: Former    Packs/day: 0.50    Types: Cigarettes    Quit date: 10/25/2021    Years since quitting: 0.0   Smokeless tobacco: Never  Substance Use Topics   Alcohol use: Yes    Comment: occasional   Drug use: Not Currently    Types: Marijuana     Allergies   Patient has no known allergies.   Review of Systems Review of Systems  Constitutional:  Negative for chills and fever.  Eyes:  Negative for discharge and redness.  Respiratory:  Negative for shortness of breath.   Genitourinary:  Negative for genital  sores and penile discharge.  Skin:  Positive for color change and wound.  Neurological:  Negative for numbness.    Physical Exam Triage Vital Signs ED Triage Vitals  Enc Vitals Group     BP 11/15/21 1313 130/87     Pulse Rate 11/15/21 1313 84     Resp 11/15/21 1313 18     Temp 11/15/21 1313 98.4 F (36.9 C)     Temp Source 11/15/21 1313 Oral     SpO2 11/15/21 1313 96 %     Weight --      Height --      Head Circumference --      Peak Flow --      Pain Score 11/15/21 1315 0     Pain Loc --      Pain Edu? --      Excl. in GC? --    No data found.  Updated Vital Signs BP 130/87 (BP Location: Left Arm)    Pulse 84    Temp 98.4 F (36.9 C) (Oral)    Resp 18    SpO2 96%       Physical Exam Vitals and nursing note reviewed.  Constitutional:      General: He is not in acute distress.    Appearance: Normal appearance.  He is not ill-appearing.  HENT:     Head: Normocephalic and atraumatic.  Eyes:     Conjunctiva/sclera: Conjunctivae normal.  Cardiovascular:     Rate and Rhythm: Normal rate.  Pulmonary:     Effort: Pulmonary effort is normal.  Neurological:     Mental Status: He is alert.  Psychiatric:        Mood and Affect: Mood normal.        Behavior: Behavior normal.        Thought Content: Thought content normal.     UC Treatments / Results  Labs (all labs ordered are listed, but only abnormal results are displayed) Labs Reviewed  CYTOLOGY, (ORAL, ANAL, URETHRAL) ANCILLARY ONLY    EKG   Radiology No results found.  Procedures Procedures (including critical care time)  Medications Ordered in UC Medications - No data to display  Initial Impression / Assessment and Plan / UC Course  I have reviewed the triage vital signs and the nursing notes.  Pertinent labs & imaging results that were available during my care of the patient were reviewed by me and considered in my medical decision making (see chart for details).    STD screening ordered as  requested.  Will await results further recommendation.  Final Clinical Impressions(s) / UC Diagnoses   Final diagnoses:  Screening for STD (sexually transmitted disease)   Discharge Instructions   None    ED Prescriptions   None    PDMP not reviewed this encounter.   Tomi Bamberger, PA-C 11/15/21 1329

## 2021-11-15 NOTE — ED Triage Notes (Signed)
Pt was tx at the beginning of the month for trich. He presents today for an STD recheck. Denies sxs and exposures.

## 2021-11-19 LAB — CYTOLOGY, (ORAL, ANAL, URETHRAL) ANCILLARY ONLY
Chlamydia: NEGATIVE
Comment: NEGATIVE
Comment: NEGATIVE
Comment: NORMAL
Neisseria Gonorrhea: NEGATIVE
Trichomonas: NEGATIVE

## 2021-12-07 ENCOUNTER — Ambulatory Visit: Payer: Self-pay
